# Patient Record
Sex: Female | Born: 1950 | Race: White | Hispanic: No | Marital: Married | Smoking: Never smoker
Health system: Southern US, Community
[De-identification: ages and names within clinical notes are randomized; demographics above are authoritative.]

## PROBLEM LIST (undated history)

## (undated) DIAGNOSIS — C801 Malignant (primary) neoplasm, unspecified: Secondary | ICD-10-CM

## (undated) DIAGNOSIS — E785 Hyperlipidemia, unspecified: Secondary | ICD-10-CM

## (undated) DIAGNOSIS — C55 Malignant neoplasm of uterus, part unspecified: Secondary | ICD-10-CM

## (undated) DIAGNOSIS — T8859XA Other complications of anesthesia, initial encounter: Secondary | ICD-10-CM

## (undated) DIAGNOSIS — Z923 Personal history of irradiation: Secondary | ICD-10-CM

## (undated) HISTORY — DX: Hyperlipidemia, unspecified: E78.5

## (undated) HISTORY — PX: AUGMENTATION MAMMAPLASTY: SUR837

## (undated) HISTORY — PX: MENISCUS REPAIR: SHX5179

## (undated) HISTORY — PX: BREAST EXCISIONAL BIOPSY: SUR124

---
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
## 2005-03-27 ENCOUNTER — Ambulatory Visit: Payer: Self-pay | Admitting: Gynecologic Oncology

## 2009-01-24 ENCOUNTER — Encounter: Admission: RE | Admit: 2009-01-24 | Discharge: 2009-01-24 | Payer: Self-pay | Admitting: Family Medicine

## 2011-05-27 ENCOUNTER — Other Ambulatory Visit: Payer: Self-pay | Admitting: Family Medicine

## 2011-05-27 DIAGNOSIS — Z1231 Encounter for screening mammogram for malignant neoplasm of breast: Secondary | ICD-10-CM

## 2011-06-12 ENCOUNTER — Ambulatory Visit
Admission: RE | Admit: 2011-06-12 | Discharge: 2011-06-12 | Disposition: A | Discharge status: Home / Self Care | Payer: BC Managed Care – PPO | Source: Ambulatory Visit | Attending: Family Medicine | Admitting: Family Medicine

## 2011-06-12 DIAGNOSIS — Z1231 Encounter for screening mammogram for malignant neoplasm of breast: Secondary | ICD-10-CM

## 2012-02-02 DIAGNOSIS — R9431 Abnormal electrocardiogram [ECG] [EKG]: Secondary | ICD-10-CM

## 2012-07-14 ENCOUNTER — Other Ambulatory Visit: Payer: Self-pay | Admitting: Family Medicine

## 2012-07-14 DIAGNOSIS — Z1231 Encounter for screening mammogram for malignant neoplasm of breast: Secondary | ICD-10-CM

## 2012-08-29 ENCOUNTER — Ambulatory Visit
Admission: RE | Admit: 2012-08-29 | Discharge: 2012-08-29 | Disposition: A | Discharge status: Home / Self Care | Payer: BC Managed Care – PPO | Source: Ambulatory Visit | Attending: Family Medicine | Admitting: Family Medicine

## 2012-08-29 DIAGNOSIS — Z1231 Encounter for screening mammogram for malignant neoplasm of breast: Secondary | ICD-10-CM

## 2013-07-12 ENCOUNTER — Other Ambulatory Visit (HOSPITAL_COMMUNITY)
Admission: RE | Admit: 2013-07-12 | Discharge: 2013-07-12 | Disposition: A | Discharge status: Home / Self Care | Payer: BC Managed Care – PPO | Source: Ambulatory Visit | Attending: Family Medicine | Admitting: Family Medicine

## 2013-07-12 ENCOUNTER — Other Ambulatory Visit: Payer: Self-pay | Admitting: Family Medicine

## 2013-07-12 DIAGNOSIS — Z01419 Encounter for gynecological examination (general) (routine) without abnormal findings: Secondary | ICD-10-CM | POA: Insufficient documentation

## 2013-08-14 ENCOUNTER — Other Ambulatory Visit: Payer: Self-pay

## 2013-08-14 DIAGNOSIS — Z1231 Encounter for screening mammogram for malignant neoplasm of breast: Secondary | ICD-10-CM

## 2013-09-29 ENCOUNTER — Ambulatory Visit
Admission: RE | Admit: 2013-09-29 | Discharge: 2013-09-29 | Disposition: A | Discharge status: Home / Self Care | Payer: BC Managed Care – PPO | Source: Ambulatory Visit

## 2013-09-29 DIAGNOSIS — Z1231 Encounter for screening mammogram for malignant neoplasm of breast: Secondary | ICD-10-CM

## 2014-09-19 ENCOUNTER — Other Ambulatory Visit: Payer: Self-pay | Admitting: Family Medicine

## 2014-09-19 ENCOUNTER — Other Ambulatory Visit (HOSPITAL_COMMUNITY)
Admission: RE | Admit: 2014-09-19 | Discharge: 2014-09-19 | Disposition: A | Discharge status: Home / Self Care | Payer: BC Managed Care – PPO | Source: Ambulatory Visit | Attending: Family Medicine | Admitting: Family Medicine

## 2014-09-19 DIAGNOSIS — Z01419 Encounter for gynecological examination (general) (routine) without abnormal findings: Secondary | ICD-10-CM | POA: Insufficient documentation

## 2014-09-24 LAB — CYTOLOGY - PAP

## 2014-10-22 ENCOUNTER — Other Ambulatory Visit: Payer: Self-pay

## 2014-10-22 DIAGNOSIS — Z1231 Encounter for screening mammogram for malignant neoplasm of breast: Secondary | ICD-10-CM

## 2014-11-06 ENCOUNTER — Ambulatory Visit
Admission: RE | Admit: 2014-11-06 | Discharge: 2014-11-06 | Disposition: A | Discharge status: Home / Self Care | Payer: 59 | Source: Ambulatory Visit

## 2014-11-06 DIAGNOSIS — Z1231 Encounter for screening mammogram for malignant neoplasm of breast: Secondary | ICD-10-CM

## 2015-09-23 ENCOUNTER — Other Ambulatory Visit (HOSPITAL_COMMUNITY)
Admission: RE | Admit: 2015-09-23 | Discharge: 2015-09-23 | Disposition: A | Discharge status: Home / Self Care | Payer: 59 | Source: Ambulatory Visit | Attending: Family Medicine | Admitting: Family Medicine

## 2015-09-23 ENCOUNTER — Other Ambulatory Visit: Payer: Self-pay | Admitting: Family Medicine

## 2015-09-23 DIAGNOSIS — Z01419 Encounter for gynecological examination (general) (routine) without abnormal findings: Secondary | ICD-10-CM | POA: Diagnosis not present

## 2015-09-24 LAB — CYTOLOGY - PAP

## 2015-10-03 ENCOUNTER — Other Ambulatory Visit: Payer: Self-pay

## 2015-10-03 DIAGNOSIS — Z1231 Encounter for screening mammogram for malignant neoplasm of breast: Secondary | ICD-10-CM

## 2015-11-08 ENCOUNTER — Ambulatory Visit
Admission: RE | Admit: 2015-11-08 | Discharge: 2015-11-08 | Disposition: A | Discharge status: Home / Self Care | Payer: 59 | Source: Ambulatory Visit

## 2015-11-08 DIAGNOSIS — Z1231 Encounter for screening mammogram for malignant neoplasm of breast: Secondary | ICD-10-CM

## 2016-05-05 ENCOUNTER — Other Ambulatory Visit: Payer: Self-pay | Admitting: Family Medicine

## 2016-05-07 ENCOUNTER — Other Ambulatory Visit: Payer: Self-pay | Admitting: Family Medicine

## 2016-05-07 DIAGNOSIS — Z8249 Family history of ischemic heart disease and other diseases of the circulatory system: Secondary | ICD-10-CM

## 2016-05-19 ENCOUNTER — Ambulatory Visit
Admission: RE | Admit: 2016-05-19 | Discharge: 2016-05-19 | Disposition: A | Discharge status: Home / Self Care | Payer: 59 | Source: Ambulatory Visit | Attending: Family Medicine | Admitting: Family Medicine

## 2016-05-19 DIAGNOSIS — Z8249 Family history of ischemic heart disease and other diseases of the circulatory system: Secondary | ICD-10-CM

## 2016-09-30 ENCOUNTER — Other Ambulatory Visit (HOSPITAL_COMMUNITY)
Admission: RE | Admit: 2016-09-30 | Discharge: 2016-09-30 | Disposition: A | Discharge status: Home / Self Care | Payer: 59 | Source: Ambulatory Visit | Attending: Family Medicine | Admitting: Family Medicine

## 2016-09-30 ENCOUNTER — Other Ambulatory Visit: Payer: Self-pay | Admitting: Family Medicine

## 2016-09-30 DIAGNOSIS — Z01419 Encounter for gynecological examination (general) (routine) without abnormal findings: Secondary | ICD-10-CM | POA: Diagnosis present

## 2016-10-02 ENCOUNTER — Other Ambulatory Visit: Payer: Self-pay | Admitting: Family Medicine

## 2016-10-02 DIAGNOSIS — Z1231 Encounter for screening mammogram for malignant neoplasm of breast: Secondary | ICD-10-CM

## 2016-10-02 LAB — CYTOLOGY - PAP: DIAGNOSIS: NEGATIVE

## 2016-10-26 ENCOUNTER — Other Ambulatory Visit: Payer: Self-pay | Admitting: Family Medicine

## 2016-11-09 ENCOUNTER — Ambulatory Visit
Admission: RE | Admit: 2016-11-09 | Discharge: 2016-11-09 | Disposition: A | Discharge status: Home / Self Care | Payer: 59 | Source: Ambulatory Visit | Attending: Family Medicine | Admitting: Family Medicine

## 2016-11-09 ENCOUNTER — Ambulatory Visit: Payer: 59

## 2016-11-09 DIAGNOSIS — Z1231 Encounter for screening mammogram for malignant neoplasm of breast: Secondary | ICD-10-CM

## 2016-11-10 ENCOUNTER — Ambulatory Visit: Payer: 59

## 2017-10-20 ENCOUNTER — Other Ambulatory Visit: Payer: Self-pay | Admitting: Family Medicine

## 2017-10-20 DIAGNOSIS — Z1231 Encounter for screening mammogram for malignant neoplasm of breast: Secondary | ICD-10-CM

## 2017-11-22 ENCOUNTER — Ambulatory Visit
Admission: RE | Admit: 2017-11-22 | Discharge: 2017-11-22 | Disposition: A | Discharge status: Home / Self Care | Payer: Medicare Other | Source: Ambulatory Visit | Attending: Family Medicine | Admitting: Family Medicine

## 2017-11-22 DIAGNOSIS — Z1231 Encounter for screening mammogram for malignant neoplasm of breast: Secondary | ICD-10-CM

## 2017-12-02 DIAGNOSIS — R300 Dysuria: Secondary | ICD-10-CM | POA: Diagnosis not present

## 2017-12-29 ENCOUNTER — Other Ambulatory Visit: Payer: Self-pay | Admitting: Family Medicine

## 2017-12-29 ENCOUNTER — Other Ambulatory Visit (HOSPITAL_COMMUNITY)
Admission: RE | Admit: 2017-12-29 | Discharge: 2017-12-29 | Disposition: A | Discharge status: Home / Self Care | Payer: Medicare Other | Source: Ambulatory Visit | Attending: Family Medicine | Admitting: Family Medicine

## 2017-12-29 DIAGNOSIS — E785 Hyperlipidemia, unspecified: Secondary | ICD-10-CM | POA: Diagnosis not present

## 2017-12-29 DIAGNOSIS — Z1389 Encounter for screening for other disorder: Secondary | ICD-10-CM | POA: Diagnosis not present

## 2017-12-29 DIAGNOSIS — Z0000 Encounter for general adult medical examination without abnormal findings: Secondary | ICD-10-CM | POA: Insufficient documentation

## 2017-12-29 DIAGNOSIS — Z124 Encounter for screening for malignant neoplasm of cervix: Secondary | ICD-10-CM | POA: Diagnosis not present

## 2017-12-29 DIAGNOSIS — M810 Age-related osteoporosis without current pathological fracture: Secondary | ICD-10-CM | POA: Diagnosis not present

## 2017-12-30 LAB — CYTOLOGY - PAP
Diagnosis: NEGATIVE
HPV: NOT DETECTED

## 2018-02-25 DIAGNOSIS — K1379 Other lesions of oral mucosa: Secondary | ICD-10-CM | POA: Diagnosis not present

## 2018-08-30 DIAGNOSIS — Z23 Encounter for immunization: Secondary | ICD-10-CM | POA: Diagnosis not present

## 2018-09-14 DIAGNOSIS — R350 Frequency of micturition: Secondary | ICD-10-CM | POA: Diagnosis not present

## 2018-09-14 DIAGNOSIS — N3000 Acute cystitis without hematuria: Secondary | ICD-10-CM | POA: Diagnosis not present

## 2018-09-19 DIAGNOSIS — H43393 Other vitreous opacities, bilateral: Secondary | ICD-10-CM | POA: Diagnosis not present

## 2018-10-28 ENCOUNTER — Other Ambulatory Visit: Payer: Self-pay | Admitting: Family Medicine

## 2018-10-28 DIAGNOSIS — Z1231 Encounter for screening mammogram for malignant neoplasm of breast: Secondary | ICD-10-CM

## 2018-11-28 ENCOUNTER — Other Ambulatory Visit: Payer: Self-pay | Admitting: Family Medicine

## 2018-11-28 ENCOUNTER — Ambulatory Visit
Admission: RE | Admit: 2018-11-28 | Discharge: 2018-11-28 | Disposition: A | Discharge status: Home / Self Care | Payer: Medicare Other | Source: Ambulatory Visit | Attending: Family Medicine | Admitting: Family Medicine

## 2018-11-28 DIAGNOSIS — Z1231 Encounter for screening mammogram for malignant neoplasm of breast: Secondary | ICD-10-CM

## 2018-12-29 DIAGNOSIS — E785 Hyperlipidemia, unspecified: Secondary | ICD-10-CM | POA: Diagnosis not present

## 2019-01-04 DIAGNOSIS — Z1389 Encounter for screening for other disorder: Secondary | ICD-10-CM | POA: Diagnosis not present

## 2019-01-04 DIAGNOSIS — Z0000 Encounter for general adult medical examination without abnormal findings: Secondary | ICD-10-CM | POA: Diagnosis not present

## 2019-01-04 DIAGNOSIS — M810 Age-related osteoporosis without current pathological fracture: Secondary | ICD-10-CM | POA: Diagnosis not present

## 2019-01-04 DIAGNOSIS — E785 Hyperlipidemia, unspecified: Secondary | ICD-10-CM | POA: Diagnosis not present

## 2019-01-06 DIAGNOSIS — N390 Urinary tract infection, site not specified: Secondary | ICD-10-CM | POA: Diagnosis not present

## 2019-06-14 DIAGNOSIS — H6122 Impacted cerumen, left ear: Secondary | ICD-10-CM | POA: Diagnosis not present

## 2019-06-14 DIAGNOSIS — H938X9 Other specified disorders of ear, unspecified ear: Secondary | ICD-10-CM | POA: Diagnosis not present

## 2019-07-04 DIAGNOSIS — D225 Melanocytic nevi of trunk: Secondary | ICD-10-CM | POA: Diagnosis not present

## 2019-07-04 DIAGNOSIS — L570 Actinic keratosis: Secondary | ICD-10-CM | POA: Diagnosis not present

## 2019-07-04 DIAGNOSIS — L723 Sebaceous cyst: Secondary | ICD-10-CM | POA: Diagnosis not present

## 2019-07-04 DIAGNOSIS — L814 Other melanin hyperpigmentation: Secondary | ICD-10-CM | POA: Diagnosis not present

## 2019-07-04 DIAGNOSIS — D1800 Hemangioma unspecified site: Secondary | ICD-10-CM | POA: Diagnosis not present

## 2019-07-08 DIAGNOSIS — Z23 Encounter for immunization: Secondary | ICD-10-CM | POA: Diagnosis not present

## 2019-09-06 DIAGNOSIS — R300 Dysuria: Secondary | ICD-10-CM | POA: Diagnosis not present

## 2019-09-20 DIAGNOSIS — Z20828 Contact with and (suspected) exposure to other viral communicable diseases: Secondary | ICD-10-CM | POA: Diagnosis not present

## 2019-11-02 ENCOUNTER — Other Ambulatory Visit: Payer: Self-pay | Admitting: Family Medicine

## 2019-11-02 DIAGNOSIS — Z9289 Personal history of other medical treatment: Secondary | ICD-10-CM

## 2019-12-22 ENCOUNTER — Ambulatory Visit: Payer: Medicare Other

## 2020-01-12 ENCOUNTER — Ambulatory Visit
Admission: RE | Admit: 2020-01-12 | Discharge: 2020-01-12 | Disposition: A | Discharge status: Home / Self Care | Payer: Medicare Other | Source: Ambulatory Visit | Attending: Family Medicine | Admitting: Family Medicine

## 2020-01-12 ENCOUNTER — Other Ambulatory Visit: Payer: Self-pay

## 2020-01-12 DIAGNOSIS — Z1231 Encounter for screening mammogram for malignant neoplasm of breast: Secondary | ICD-10-CM | POA: Diagnosis not present

## 2020-01-12 DIAGNOSIS — Z9289 Personal history of other medical treatment: Secondary | ICD-10-CM

## 2020-01-12 DIAGNOSIS — E785 Hyperlipidemia, unspecified: Secondary | ICD-10-CM | POA: Diagnosis not present

## 2020-01-15 DIAGNOSIS — M810 Age-related osteoporosis without current pathological fracture: Secondary | ICD-10-CM | POA: Diagnosis not present

## 2020-01-15 DIAGNOSIS — Z1389 Encounter for screening for other disorder: Secondary | ICD-10-CM | POA: Diagnosis not present

## 2020-01-15 DIAGNOSIS — E785 Hyperlipidemia, unspecified: Secondary | ICD-10-CM | POA: Diagnosis not present

## 2020-01-15 DIAGNOSIS — Z0000 Encounter for general adult medical examination without abnormal findings: Secondary | ICD-10-CM | POA: Diagnosis not present

## 2020-01-17 ENCOUNTER — Other Ambulatory Visit: Payer: Self-pay | Admitting: Family Medicine

## 2020-01-17 DIAGNOSIS — M810 Age-related osteoporosis without current pathological fracture: Secondary | ICD-10-CM

## 2020-04-01 ENCOUNTER — Ambulatory Visit
Admission: RE | Admit: 2020-04-01 | Discharge: 2020-04-01 | Disposition: A | Discharge status: Home / Self Care | Payer: Medicare Other | Source: Ambulatory Visit | Attending: Family Medicine | Admitting: Family Medicine

## 2020-04-01 ENCOUNTER — Other Ambulatory Visit: Payer: Self-pay

## 2020-04-01 DIAGNOSIS — M85851 Other specified disorders of bone density and structure, right thigh: Secondary | ICD-10-CM | POA: Diagnosis not present

## 2020-04-01 DIAGNOSIS — Z780 Asymptomatic menopausal state: Secondary | ICD-10-CM | POA: Diagnosis not present

## 2020-04-01 DIAGNOSIS — M810 Age-related osteoporosis without current pathological fracture: Secondary | ICD-10-CM | POA: Diagnosis not present

## 2020-07-18 DIAGNOSIS — D1800 Hemangioma unspecified site: Secondary | ICD-10-CM | POA: Diagnosis not present

## 2020-07-18 DIAGNOSIS — L814 Other melanin hyperpigmentation: Secondary | ICD-10-CM | POA: Diagnosis not present

## 2020-07-18 DIAGNOSIS — L603 Nail dystrophy: Secondary | ICD-10-CM | POA: Diagnosis not present

## 2020-07-18 DIAGNOSIS — L821 Other seborrheic keratosis: Secondary | ICD-10-CM | POA: Diagnosis not present

## 2020-07-18 DIAGNOSIS — D225 Melanocytic nevi of trunk: Secondary | ICD-10-CM | POA: Diagnosis not present

## 2020-07-18 DIAGNOSIS — L578 Other skin changes due to chronic exposure to nonionizing radiation: Secondary | ICD-10-CM | POA: Diagnosis not present

## 2020-07-23 DIAGNOSIS — N951 Menopausal and female climacteric states: Secondary | ICD-10-CM | POA: Diagnosis not present

## 2020-07-23 DIAGNOSIS — N950 Postmenopausal bleeding: Secondary | ICD-10-CM | POA: Diagnosis not present

## 2020-07-24 ENCOUNTER — Other Ambulatory Visit: Payer: Self-pay | Admitting: Family Medicine

## 2020-07-24 DIAGNOSIS — N950 Postmenopausal bleeding: Secondary | ICD-10-CM

## 2020-07-30 ENCOUNTER — Ambulatory Visit
Admission: RE | Admit: 2020-07-30 | Discharge: 2020-07-30 | Disposition: A | Discharge status: Home / Self Care | Payer: Medicare Other | Source: Ambulatory Visit | Attending: Family Medicine | Admitting: Family Medicine

## 2020-07-30 DIAGNOSIS — Z780 Asymptomatic menopausal state: Secondary | ICD-10-CM | POA: Diagnosis not present

## 2020-07-30 DIAGNOSIS — R9389 Abnormal findings on diagnostic imaging of other specified body structures: Secondary | ICD-10-CM | POA: Diagnosis not present

## 2020-07-30 DIAGNOSIS — N950 Postmenopausal bleeding: Secondary | ICD-10-CM | POA: Diagnosis not present

## 2020-08-07 DIAGNOSIS — Z1279 Encounter for screening for malignant neoplasm of other genitourinary organs: Secondary | ICD-10-CM | POA: Diagnosis not present

## 2020-08-07 DIAGNOSIS — C541 Malignant neoplasm of endometrium: Secondary | ICD-10-CM | POA: Diagnosis not present

## 2020-08-07 DIAGNOSIS — Z124 Encounter for screening for malignant neoplasm of cervix: Secondary | ICD-10-CM | POA: Diagnosis not present

## 2020-08-07 DIAGNOSIS — N950 Postmenopausal bleeding: Secondary | ICD-10-CM | POA: Diagnosis not present

## 2020-08-07 DIAGNOSIS — R8290 Unspecified abnormal findings in urine: Secondary | ICD-10-CM | POA: Diagnosis not present

## 2020-08-08 DIAGNOSIS — Z124 Encounter for screening for malignant neoplasm of cervix: Secondary | ICD-10-CM | POA: Diagnosis not present

## 2020-08-14 ENCOUNTER — Telehealth: Payer: Self-pay | Admitting: *Deleted

## 2020-08-14 NOTE — Telephone Encounter (Signed)
Called the patient and scheduled a new patient appt for 11/2 at 8:30 am with Dr Denman George. Patient given the address and phone number for the clinic. Patient also given the policy for mask and visitors

## 2020-08-19 ENCOUNTER — Encounter: Payer: Self-pay | Admitting: Gynecologic Oncology

## 2020-08-20 ENCOUNTER — Other Ambulatory Visit: Payer: Self-pay | Admitting: Gynecologic Oncology

## 2020-08-20 ENCOUNTER — Inpatient Hospital Stay: Payer: Medicare Other | Attending: Gynecologic Oncology | Admitting: Gynecologic Oncology

## 2020-08-20 ENCOUNTER — Other Ambulatory Visit: Payer: Self-pay

## 2020-08-20 ENCOUNTER — Encounter: Payer: Self-pay | Admitting: Gynecologic Oncology

## 2020-08-20 VITALS — BP 128/82 | HR 80 | Temp 97.9°F | Resp 18 | Ht 62.36 in | Wt 128.8 lb

## 2020-08-20 DIAGNOSIS — E7800 Pure hypercholesterolemia, unspecified: Secondary | ICD-10-CM | POA: Diagnosis not present

## 2020-08-20 DIAGNOSIS — Z79899 Other long term (current) drug therapy: Secondary | ICD-10-CM | POA: Insufficient documentation

## 2020-08-20 DIAGNOSIS — C541 Malignant neoplasm of endometrium: Secondary | ICD-10-CM | POA: Insufficient documentation

## 2020-08-20 MED ORDER — TRAMADOL HCL 50 MG PO TABS: 50.0000 mg | ORAL_TABLET | Freq: Four times a day (QID) | ORAL | 0 refills | Status: DC | PRN

## 2020-08-20 MED ORDER — SENNOSIDES-DOCUSATE SODIUM 8.6-50 MG PO TABS: 2.0000 | ORAL_TABLET | Freq: Every day | ORAL | 0 refills | Status: DC

## 2020-08-20 MED ORDER — IBUPROFEN 600 MG PO TABS: 600.0000 mg | ORAL_TABLET | Freq: Four times a day (QID) | ORAL | 0 refills | Status: DC | PRN

## 2020-08-20 NOTE — Patient Instructions (Signed)
Preparing for your Surgery  Plan for surgery on September 10, 2020 with Dr. Everitt Amber at [place] will be scheduled for a robotic assisted total laparoscopic hysterectomy (removal of the uterus and cervix), bilateral salpingo-oophorectomy (removal of both ovaries and fallopian tubes), sentinel lymph node biopsy, possible lymph node dissection.   Pre-operative Testing -You will receive a phone call from presurgical testing at [facility] to arrange for a pre-operative appointment, lab appointment, and COVID test. The COVID test normally happens 3 days prior to the surgery and they ask that you self quarantine after the test up until surgery to decrease chance of exposure.  -Bring your insurance card, copy of an advanced directive if applicable, medication list  -At that visit, you will be asked to sign a consent for a possible blood transfusion in case a transfusion becomes necessary during surgery.  The need for a blood transfusion is rare but having consent is a necessary part of your care.     -You should not be taking blood thinners or aspirin at least ten days prior to surgery unless instructed by your surgeon.  -Do not take supplements such as fish oil (omega 3), red yeast rice, turmeric before your surgery.   Day Before Surgery at [place] will be asked to take in a light diet the day before surgery. You will be advised you can have clear liquids up until 3 hours before your surgery.    Eat a light diet the day before surgery.  Examples including soups, broths, toast, yogurt, mashed potatoes.  AVOID GAS PRODUCING FOODS. Things to avoid include carbonated beverages (fizzy beverages), raw fruits and raw vegetables, or beans.   If your bowels are filled with gas, your surgeon will have difficulty visualizing your pelvic organs which increases your surgical risks.  Your role in recovery Your role is to become active as soon as directed by your doctor, while still  giving yourself time to heal.  Rest when you feel tired. You will be asked to do the following in order to speed your recovery:  - Cough and breathe deeply. This helps to clear and expand your lungs and can prevent pneumonia after surgery.  - [place]. Do mild physical activity. Walking or moving your legs help your circulation and body functions return to normal. Do not try to get up or walk alone the first time after surgery.   -If you develop swelling on one leg or the other, pain in the back of your leg, redness/warmth in one of your legs, please call the office or go to the Emergency Room to have a doppler to rule out a blood clot. For shortness of breath, chest pain-seek care in the Emergency Room as soon as possible. - Actively manage your pain. Managing your pain lets you move in comfort. We will ask you to rate your pain on a scale of zero to 10. It is your responsibility to tell your doctor or nurse where and how much you hurt so your pain can be treated.  Special Considerations -If you are diabetic, you may be placed on insulin after surgery to have closer control over your blood sugars to promote healing and recovery.  This does not mean that you will be discharged on insulin.  If applicable, your oral antidiabetics will be resumed when you are tolerating a solid diet.  -Your final pathology results from surgery should be available around one week after surgery and  the results will be relayed to you when available.  -Dr. Lahoma Crocker is the surgeon that assists your GYN Oncologist with surgery.  If you end up staying the night, the next day after your surgery you will either see Dr. Denman George, Dr. Berline Lopes, or Dr. Lahoma Crocker.  -FMLA forms can be faxed to [phone] and please allow 5-7 business days for completion.  Pain Management After Surgery -You have been prescribed your pain medication and bowel regimen medications before surgery so that you can have  these available when you are discharged from the hospital. The pain medication is for use ONLY AFTER surgery and a new prescription will not be given.   -Make sure that you have Tylenol and Ibuprofen at home to use on a regular basis after surgery for pain control. We recommend alternating the medications every hour to six hours since they work differently and are processed in the body differently for pain relief.  -Review the attached handout on narcotic use and their risks and side effects.   Bowel Regimen -You have been prescribed Sennakot-S to take nightly to prevent constipation especially if you are taking the narcotic pain medication intermittently.  It is important to prevent constipation and drink adequate amounts of liquids. You can stop taking this medication when you are not taking pain medication and you are back on your normal bowel routine.  Risks of Surgery Risks of surgery are low but include bleeding, infection, damage to surrounding structures, re-operation, blood clots, and very rarely death.   Blood Transfusion Information (For the consent to be signed before surgery)  We will be checking your blood type before surgery so in case of emergencies, we will know what type of blood you would need.                                            WHAT IS A BLOOD TRANSFUSION?  A transfusion is the replacement of blood or some of its parts. Blood is made up of multiple cells which provide different functions.  Red blood cells carry oxygen and are used for blood loss replacement.  White blood cells fight against infection.  Platelets control bleeding.  Plasma helps clot blood.  Other blood products are available for specialized needs, such as hemophilia or other clotting disorders. BEFORE THE TRANSFUSION  Who gives blood for transfusions?   You may be able to donate blood to be used at a later date on yourself (autologous donation).  Relatives can be asked to donate blood.  This is generally not any safer than if you have received blood from a stranger. The same precautions are taken to ensure safety when a relative's blood is donated.  Healthy volunteers who are fully evaluated to make sure their blood is safe. This is blood bank blood. Transfusion therapy is the safest it has ever been in the practice of medicine. Before blood is taken from a donor, a complete history is taken to make sure that person has no history of diseases nor engages in risky social behavior (examples are intravenous drug use or sexual activity with multiple partners). The donor's travel history is screened to minimize risk of transmitting infections, such as malaria. The donated blood is tested for signs of infectious diseases, such as HIV and hepatitis. The blood is then tested to be sure it is compatible with you in order to  minimize the chance of a transfusion reaction. If you or a relative donates blood, this is often done in anticipation of surgery and is not appropriate for emergency situations. It takes many days to process the donated blood. RISKS AND COMPLICATIONS Although transfusion therapy is very safe and saves many lives, the main dangers of transfusion include:   Getting an infectious disease.  Developing a transfusion reaction. This is an allergic reaction to something in the blood you were given. Every precaution is taken to prevent this. The decision to have a blood transfusion has been considered carefully by your caregiver before blood is given. Blood is not given unless the benefits outweigh the risks.  AFTER SURGERY INSTRUCTIONS  Return to work: 4 weeks if applicable  Activity: 1. Be up and out of the bed during the day.  Take a nap if needed.  You may walk up steps but be careful and use the hand rail.  Stair climbing will tire you more than you think, you may need to stop part way and rest.   2. No lifting or straining for 6 weeks over 10 pounds. No pushing, pulling,  straining for 6 weeks.  3. No driving for 1 week(s).  Do not drive if you are taking narcotic pain medicine and make sure that your reaction time has returned.   4. You can shower as soon as the next day after surgery. Shower daily.  Use your regular soap and water (not directly on the incision) and pat your incision(s) dry afterwards; don't rub.  No tub baths or submerging your body in water until cleared by your surgeon. If you have the soap that was given to you by pre-surgical testing that was used before surgery, you do not need to use it afterwards because this can irritate your incisions.   5. No sexual activity and nothing in the vagina for 8 weeks.  6. You may experience a small amount of clear drainage from your incisions, which is normal.  If the drainage persists, increases, or changes color please call the office.  7. Do not use creams, lotions, or ointments such as neosporin on your incisions after surgery until advised by your surgeon because they can cause removal of the dermabond glue on your incisions.    8. You may experience vaginal spotting after surgery or around the 6-8 week mark from surgery when the stitches at the top of the vagina begin to dissolve.  The spotting is normal but if you experience heavy bleeding, call our office.  9. Take Tylenol or ibuprofen first for pain and only use narcotic pain medication for severe pain not relieved by the Tylenol or Ibuprofen.  Monitor your Tylenol intake to a max of 4,000 mg in a 24 hour period. You can alternate these medications after surgery.  Diet: 1. Low sodium Heart Healthy Diet is recommended but you are cleared to resume your normal (before surgery) diet after your procedure.  2. It is safe to use a laxative, such as Miralax or Colace, if you have difficulty moving your bowels. You have been prescribed Sennakot at bedtime every evening to keep bowel movements regular and to prevent constipation.    Wound Care: 1. Keep  clean and dry.  Shower daily.  Reasons to call the Doctor:  Fever - Oral temperature greater than 100.4 degrees Fahrenheit  Foul-smelling vaginal discharge  Difficulty urinating  Nausea and vomiting  Increased pain at the site of the incision that is unrelieved with pain  medicine.  Difficulty breathing with or without chest pain  New calf pain especially if only on one side  Sudden, continuing increased vaginal bleeding with or without clots.   Contacts: For questions or concerns you should contact:  Dr. Everitt Amber at [phone]  Joylene John, NP at [phone]  After Hours: call [phone] and have the GYN Oncologist paged/contacted (after 5 pm or on the weekends)

## 2020-08-20 NOTE — H&P (View-Only) (Signed)
Consult Note: Gyn-Onc  Consult was requested by Dr. Ulanda Edison for the evaluation of Paula Newman 69 y.o. female  CC:  Chief Complaint  Patient presents with  . Endometrial cancer    Assessment/Plan:  Paula Newman  is a 69 y.o.  year old P1 with grade 1 endometrial cancer.  A detailed discussion was held with the patient with regard to to her endometrial cancer diagnosis. We discussed the standard management options for uterine cancer which includes surgery followed possibly by adjuvant therapy depending on the results of surgery. The surgical management include a robotic assisted total hysterectomy and removal of the tubes and ovaries with sampling of lymph nodes. If a minimally invasive approach is not feasible, a laparotomy may be necessary (including for specimen delivery). The patient has been counseled about these surgical options and the risks of surgery in general including infection, bleeding, damage to surrounding structures (including bowel, bladder, ureters, nerves or vessels), and the postoperative risks of PE/ DVT, and lymphedema. After counseling and consideration of her options, she is in agreement to proceed with robotic assisted total hysterectomy with bilateral sapingo-oophorectomy and SLN biopsy.   She will be seen by anesthesia for preoperative clearance and discussion of postoperative pain management.  She was given the opportunity to ask questions, which were answered to her satisfaction, and she is agreement with the above mentioned plan of care.   We explained that robotic hysterectomy is typically an outpatient procedure with same day discharge provided that she is meeting appropriate discharge criteria from the PACU. We provided extensive counseling regarding post-operative expectations for recovery and restrictions/limitations. We provided information regarding multi-modal pain therapy and the importance of avoidance of opioids.   We explained that after  surgery we will review her definitive pathology and determine if adjuvant therapy is recommended.   HPI: Paula Newman is a 69 year old P 1 who was seen in consultation at the request of Dr Ulanda Edison for evaluation and treatment of grade 1 endometrial cancer.   Her symptoms began in October 2021 with vaginal bleeding which was postmenopausal.  She saw her primary care physician for a symptom related visit who referred her to see Dr. Ulanda Edison (she did not have a gynecologist that she received GYN care from her PCP) on August 07, 2020.  Work-up of symptoms included a transvaginal ultrasound and endometrial Pipelle and Pap smear. Transvaginal US on July 30, 2020 showed a uterus measuring 7.7 x 3.9 x 5.3 cm with an endometrial thickness of 23 mm. 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 Pap testing was normal on 08/07/2020.  The patient is otherwise quite healthy with hypercholesterolemia is her only chronic health complaint.  She has had 1 prior SVD of a 9 pound 14 ounce baby.  She has had no prior abdominal surgeries.  Her family history is remarkable for sister who had breast cancer.  The patient denied a history of exogenous estrogen use including hormone replacement therapy.  Current Meds:  Outpatient Encounter Medications as of 08/20/2020  Medication Sig  . ezetimibe (ZETIA) 10 MG tablet Take 10 mg by mouth daily.  . simvastatin (ZOCOR) 20 MG tablet Take 20 mg by mouth daily.   No facility-administered encounter medications on file as of 08/20/2020.    Allergy:  Allergies  Allergen Reactions  . Codeine Nausea And Vomiting    Social Hx:   Social History   Socioeconomic  History  . Marital status: Married    Spouse name: Not on file  . Number of children: 1  . Years of education: Not on file  . Highest education level: Not on file  Occupational History  . Not on  file  Tobacco Use  . Smoking status: Never Smoker  . Smokeless tobacco: Never Used  Substance and Sexual Activity  . Alcohol use: Yes    Comment: rare occasion  . Drug use: Never  . Sexual activity: Not on file  Other Topics Concern  . Not on file  Social History Narrative  . Not on file   Social Determinants of Health   Financial Resource Strain:   . Difficulty of Paying Living Expenses: Not on file  Food Insecurity:   . Worried About Charity fundraiser in the Last Year: Not on file  . Ran Out of Food in the Last Year: Not on file  Transportation Needs:   . Lack of Transportation (Medical): Not on file  . Lack of Transportation (Non-Medical): Not on file  Physical Activity:   . Days of Exercise per Week: Not on file  . Minutes of Exercise per Session: Not on file  Stress:   . Feeling of Stress : Not on file  Social Connections:   . Frequency of Communication with Friends and Family: Not on file  . Frequency of Social Gatherings with Friends and Family: Not on file  . Attends Religious Services: Not on file  . Active Member of Clubs or Organizations: Not on file  . Attends Archivist Meetings: Not on file  . Marital Status: Not on file  Intimate Partner Violence:   . Fear of Current or Ex-Partner: Not on file  . Emotionally Abused: Not on file  . Physically Abused: Not on file  . Sexually Abused: Not on file    Past Surgical Hx:  Past Surgical History:  Procedure Laterality Date  . AUGMENTATION MAMMAPLASTY    . BREAST EXCISIONAL BIOPSY Bilateral   . MENISCUS REPAIR Bilateral     Past Medical Hx:  Past Medical History:  Diagnosis Date  . Hyperlipidemia   . Uterine cancer [facility])     Past Gynecological History:  See HPI, svd x 1, no hx of abnormal paps No LMP recorded. Patient is postmenopausal.  Family Hx:  Family History  Problem Relation Age of Onset  . Hypertension Mother   . Hypertension Father   . Breast cancer Sister 48  . Breast cancer  Paternal Grandmother     Review of Systems:  Constitutional  Feels well,    ENT Normal appearing ears and nares bilaterally Skin/Breast  No rash, sores, jaundice, itching, dryness Cardiovascular  No chest pain, shortness of breath, or edema  Pulmonary  No cough or wheeze.  Gastro Intestinal  No nausea, vomitting, or diarrhoea. No bright red blood per rectum, no abdominal pain, change in bowel movement, or constipation.  Genito Urinary  No frequency, urgency, dysuria, + postmenopausal bleeding Musculo Skeletal  No myalgia, arthralgia, joint swelling or pain  Neurologic  No weakness, numbness, change in gait,  Psychology  No depression, anxiety, insomnia.   Vitals:  Blood pressure 128/82, pulse 80, temperature 97.9 F (36.6 C), temperature source Tympanic, resp. rate 18, height 5' 2.36" (1.584 m), weight 128 lb 12.8 oz (58.4 kg), SpO2 100 %.  Physical Exam: WD in NAD Neck  Supple NROM, without any enlargements.  Lymph Node Survey No cervical supraclavicular or inguinal adenopathy Cardiovascular  Pulse normal rate, regularity and rhythm. S1 and S2 normal.  Lungs  Clear to auscultation bilateraly, without wheezes/crackles/rhonchi. Good air movement.  Skin  No rash/lesions/breakdown  Psychiatry  Alert and oriented to person, place, and time  Abdomen  Normoactive bowel sounds, abdomen soft, non-tender and thin without evidence of hernia.  Back No CVA tenderness Genito Urinary  Vulva/vagina: Normal external female genitalia.  No lesions. No discharge or bleeding.  Bladder/urethra:  No lesions or masses, well supported bladder  Vagina: no lesions  Cervix: Normal appearing, no lesions.  Uterus: Small, mobile, no parametrial involvement or nodularity.  Adnexa: no palpable masses. Rectal  deferred Extremities  No bilateral cyanosis, clubbing or edema.  60 minutes of total time was spent for this patient encounter, including preparation, face-to-face counseling with the  patient and coordination of care, and documentation of the encounter.   Thereasa Solo, MD  08/20/2020, 9:17 AM

## 2020-08-20 NOTE — Progress Notes (Signed)
Consult Note: Gyn-Onc  Consult was requested by Dr. Ulanda Edison for the evaluation of Paula Newman 69 y.o. female  CC:  Chief Complaint  Patient presents with  . Endometrial cancer    Assessment/Plan:  Paula Newman  is a 69 y.o.  year old P1 with grade 1 endometrial cancer.  A detailed discussion was held with the patient with regard to to her endometrial cancer diagnosis. We discussed the standard management options for uterine cancer which includes surgery followed possibly by adjuvant therapy depending on the results of surgery. The surgical management include a robotic assisted total hysterectomy and removal of the tubes and ovaries with sampling of lymph nodes. If a minimally invasive approach is not feasible, a laparotomy may be necessary (including for specimen delivery). The patient has been counseled about these surgical options and the risks of surgery in general including infection, bleeding, damage to surrounding structures (including bowel, bladder, ureters, nerves or vessels), and the postoperative risks of PE/ DVT, and lymphedema. After counseling and consideration of her options, she is in agreement to proceed with robotic assisted total hysterectomy with bilateral sapingo-oophorectomy and SLN biopsy.   She will be seen by anesthesia for preoperative clearance and discussion of postoperative pain management.  She was given the opportunity to ask questions, which were answered to her satisfaction, and she is agreement with the above mentioned plan of care.   We explained that robotic hysterectomy is typically an outpatient procedure with same day discharge provided that she is meeting appropriate discharge criteria from the PACU. We provided extensive counseling regarding post-operative expectations for recovery and restrictions/limitations. We provided information regarding multi-modal pain therapy and the importance of avoidance of opioids.   We explained that after  surgery we will review her definitive pathology and determine if adjuvant therapy is recommended.   HPI: Paula Newman is a 69 year old P 1 who was seen in consultation at the request of Dr Ulanda Edison for evaluation and treatment of grade 1 endometrial cancer.   Her symptoms began in October 2021 with vaginal bleeding which was postmenopausal.  She saw her primary care physician for a symptom related visit who referred her to see Dr. Ulanda Edison (she did not have a gynecologist that she received GYN care from her PCP) on August 07, 2020.  Work-up of symptoms included a transvaginal ultrasound and endometrial Pipelle and Pap smear. Transvaginal US on July 30, 2020 showed a uterus measuring 7.7 x 3.9 x 5.3 cm with an endometrial thickness of 23 mm. 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 Pap testing was normal on 08/07/2020.  The patient is otherwise quite healthy with hypercholesterolemia is her only chronic health complaint.  She has had 1 prior SVD of a 9 pound 14 ounce baby.  She has had no prior abdominal surgeries.  Her family history is remarkable for sister who had breast cancer.  The patient denied a history of exogenous estrogen use including hormone replacement therapy.  Current Meds:  Outpatient Encounter Medications as of 08/20/2020  Medication Sig  . ezetimibe (ZETIA) 10 MG tablet Take 10 mg by mouth daily.  . simvastatin (ZOCOR) 20 MG tablet Take 20 mg by mouth daily.   No facility-administered encounter medications on file as of 08/20/2020.    Allergy:  Allergies  Allergen Reactions  . Codeine Nausea And Vomiting    Social Hx:   Social History   Socioeconomic  History  . Marital status: Married    Spouse name: Not on file  . Number of children: 1  . Years of education: Not on file  . Highest education level: Not on file  Occupational History  . Not on  file  Tobacco Use  . Smoking status: Never Smoker  . Smokeless tobacco: Never Used  Substance and Sexual Activity  . Alcohol use: Yes    Comment: rare occasion  . Drug use: Never  . Sexual activity: Not on file  Other Topics Concern  . Not on file  Social History Narrative  . Not on file   Social Determinants of Health   Financial Resource Strain:   . Difficulty of Paying Living Expenses: Not on file  Food Insecurity:   . Worried About Charity fundraiser in the Last Year: Not on file  . Ran Out of Food in the Last Year: Not on file  Transportation Needs:   . Lack of Transportation (Medical): Not on file  . Lack of Transportation (Non-Medical): Not on file  Physical Activity:   . Days of Exercise per Week: Not on file  . Minutes of Exercise per Session: Not on file  Stress:   . Feeling of Stress : Not on file  Social Connections:   . Frequency of Communication with Friends and Family: Not on file  . Frequency of Social Gatherings with Friends and Family: Not on file  . Attends Religious Services: Not on file  . Active Member of Clubs or Organizations: Not on file  . Attends Archivist Meetings: Not on file  . Marital Status: Not on file  Intimate Partner Violence:   . Fear of Current or Ex-Partner: Not on file  . Emotionally Abused: Not on file  . Physically Abused: Not on file  . Sexually Abused: Not on file    Past Surgical Hx:  Past Surgical History:  Procedure Laterality Date  . AUGMENTATION MAMMAPLASTY    . BREAST EXCISIONAL BIOPSY Bilateral   . MENISCUS REPAIR Bilateral     Past Medical Hx:  Past Medical History:  Diagnosis Date  . Hyperlipidemia   . Uterine cancer [facility])     Past Gynecological History:  See HPI, svd x 1, no hx of abnormal paps No LMP recorded. Patient is postmenopausal.  Family Hx:  Family History  Problem Relation Age of Onset  . Hypertension Mother   . Hypertension Father   . Breast cancer Sister 53  . Breast cancer  Paternal Grandmother     Review of Systems:  Constitutional  Feels well,    ENT Normal appearing ears and nares bilaterally Skin/Breast  No rash, sores, jaundice, itching, dryness Cardiovascular  No chest pain, shortness of breath, or edema  Pulmonary  No cough or wheeze.  Gastro Intestinal  No nausea, vomitting, or diarrhoea. No bright red blood per rectum, no abdominal pain, change in bowel movement, or constipation.  Genito Urinary  No frequency, urgency, dysuria, + postmenopausal bleeding Musculo Skeletal  No myalgia, arthralgia, joint swelling or pain  Neurologic  No weakness, numbness, change in gait,  Psychology  No depression, anxiety, insomnia.   Vitals:  Blood pressure 128/82, pulse 80, temperature 97.9 F (36.6 C), temperature source Tympanic, resp. rate 18, height 5' 2.36" (1.584 m), weight 128 lb 12.8 oz (58.4 kg), SpO2 100 %.  Physical Exam: WD in NAD Neck  Supple NROM, without any enlargements.  Lymph Node Survey No cervical supraclavicular or inguinal adenopathy Cardiovascular  Pulse normal rate, regularity and rhythm. S1 and S2 normal.  Lungs  Clear to auscultation bilateraly, without wheezes/crackles/rhonchi. Good air movement.  Skin  No rash/lesions/breakdown  Psychiatry  Alert and oriented to person, place, and time  Abdomen  Normoactive bowel sounds, abdomen soft, non-tender and thin without evidence of hernia.  Back No CVA tenderness Genito Urinary  Vulva/vagina: Normal external female genitalia.  No lesions. No discharge or bleeding.  Bladder/urethra:  No lesions or masses, well supported bladder  Vagina: no lesions  Cervix: Normal appearing, no lesions.  Uterus: Small, mobile, no parametrial involvement or nodularity.  Adnexa: no palpable masses. Rectal  deferred Extremities  No bilateral cyanosis, clubbing or edema.  60 minutes of total time was spent for this patient encounter, including preparation, face-to-face counseling with the  patient and coordination of care, and documentation of the encounter.   Thereasa Solo, MD  08/20/2020, 9:17 AM

## 2020-08-26 DIAGNOSIS — R059 Cough, unspecified: Secondary | ICD-10-CM | POA: Diagnosis not present

## 2020-08-29 NOTE — Patient Instructions (Addendum)
DUE TO COVID-19 ONLY ONE VISITOR IS ALLOWED TO COME WITH YOU AND STAY IN THE WAITING ROOM ONLY DURING PRE OP AND PROCEDURE DAY OF SURGERY. THE 1 VISITOR  MAY VISIT WITH YOU AFTER SURGERY IN YOUR PRIVATE ROOM DURING VISITING HOURS ONLY!  YOU NEED TO HAVE A COVID 19 TEST ON__11-19-21_____ @_______ , THIS TEST MUST BE DONE BEFORE SURGERY,  COVID TESTING SITE [address] [place] [address], IT IS ON THE RIGHT GOING OUT [address] APPROXIMATELY  2 MINUTES PAST [employer] ON THE RIGHT. ONCE YOUR COVID TEST IS COMPLETED,  PLEASE BEGIN THE QUARANTINE INSTRUCTIONS AS OUTLINED IN YOUR HANDOUT.                Paula Newman  08/29/2020   Your procedure is scheduled on: 09-10-20    Report to [facility] Main  Entrance   Report to admitting at      1130 AM     Call this number if you have problems the morning of surgery [phone]    Remember: Eat a light diet the day before surgery.  Examples including soups, broths, toast, yogurt, mashed potatoes.  Things to avoid include carbonated beverages (fizzy beverages), raw fruits and raw vegetables, or beans.   If your bowels are filled with gas, your surgeon will have difficulty visualizing your pelvic organs which increases your surgical risks.  Do not eat food After Midnight   YOU MAY HAVE CLEAR LIQUIDS UNTIL 1030 AM THEN NOTHING BY MOUTH     CLEAR LIQUID DIET   Foods Allowed                                                                                            Foods Excluded  BLACK Coffee and tea, regular and decaf                                            liquids that you cannot  Plain Jell-O any favor except red or purple                                                               see through such as: Fruit ices (not with fruit pulp)                                                           milk, soups, orange juice  Iced Popsicles  All solid  food                                Cranberry, grape and apple juices Sports drinks like Gatorade Lightly seasoned clear broth or consume(fat free) Sugar, honey syrup  _____________________________________________________________________    BRUSH YOUR TEETH MORNING OF SURGERY AND RINSE YOUR MOUTH OUT, NO CHEWING GUM CANDY OR MINTS.     Take these medicines the morning of surgery with A SIP OF WATER: NONE                                 You may not have any metal on your body including hair pins and              piercings  Do not wear jewelry, make-up, lotions, powders or perfumes, deodorant             Do not wear nail polish on your fingernails.  Do not shave  48 hours prior to surgery.     Do not bring valuables to the hospital. [place].  Contacts, dentures or bridgework may not be worn into surgery.      Patients discharged the day of surgery will not be allowed to drive home. IF YOU ARE HAVING SURGERY AND GOING HOME THE SAME DAY, YOU MUST HAVE AN ADULT TO DRIVE YOU HOME AND BE WITH YOU FOR 24 HOURS. YOU MAY GO HOME BY TAXI OR UBER OR ORTHERWISE, BUT AN ADULT MUST ACCOMPANY YOU HOME AND STAY WITH YOU FOR 24 HOURS.  Name and phone number of your driver:  Special Instructions: N/A              Please read over the following fact sheets you were given: _____________________________________________________________________             [facility] - Preparing for Surgery Before surgery, you can play an important role.  Because skin is not sterile, your skin needs to be as free of germs as possible.  You can reduce the number of germs on your skin by washing with CHG (chlorahexidine gluconate) soap before surgery.  CHG is an antiseptic cleaner which kills germs and bonds with the skin to continue killing germs even after washing. Please DO NOT use if you have an allergy to CHG or antibacterial soaps.  If your skin becomes  reddened/irritated stop using the CHG and inform your nurse when you arrive at Short Stay. Do not shave (including legs and underarms) for at least 48 hours prior to the first CHG shower.  You may shave your face/neck. Please follow these instructions carefully:  1.  Shower with CHG Soap the night before surgery and the  morning of Surgery.  2.  If you choose to wash your hair, wash your hair first as usual with your  normal  shampoo.  3.  After you shampoo, rinse your hair and body thoroughly to remove the  shampoo.                           4.  Use CHG as you would any other liquid soap.  You can apply chg directly  to the skin and wash  Gently with a scrungie or clean washcloth.  5.  Apply the CHG Soap to your body ONLY FROM THE NECK DOWN.   Do not use on face/ open                           Wound or open sores. Avoid contact with eyes, ears mouth and genitals (private parts).                       Wash face,  Genitals (private parts) with your normal soap.             6.  Wash thoroughly, paying special attention to the area where your surgery  will be performed.  7.  Thoroughly rinse your body with warm water from the neck down.  8.  DO NOT shower/wash with your normal soap after using and rinsing off  the CHG Soap.                9.  Pat yourself dry with a clean towel.            10.  Wear clean pajamas.            11.  Place clean sheets on your bed the night of your first shower and do not  sleep with pets. Day of Surgery : Do not apply any lotions/deodorants the morning of surgery.  Please wear clean clothes to the hospital/surgery center.  FAILURE TO FOLLOW THESE INSTRUCTIONS MAY RESULT IN THE CANCELLATION OF YOUR SURGERY PATIENT SIGNATURE_________________________________  NURSE SIGNATURE__________________________________  ________________________________________________________________________   Paula Newman  An incentive spirometer is a tool that  can help keep your lungs clear and active. This tool measures how well you are filling your lungs with each breath. Taking long deep breaths may help reverse or decrease the chance of developing breathing (pulmonary) problems (especially infection) following:  A long period of time when you are unable to move or be active. BEFORE THE PROCEDURE   If the spirometer includes an indicator to show your best effort, your nurse or respiratory therapist will set it to a desired goal.  If possible, sit up straight or lean slightly forward. Try not to slouch.  Hold the incentive spirometer in an upright position. INSTRUCTIONS FOR USE  1. Sit on the edge of your bed if possible, or sit up as far as you can in bed or on a chair. 2. Hold the incentive spirometer in an upright position. 3. Breathe out normally. 4. Place the mouthpiece in your mouth and seal your lips tightly around it. 5. Breathe in slowly and as deeply as possible, raising the piston or the ball toward the top of the column. 6. Hold your breath for 3-5 seconds or for as long as possible. Allow the piston or ball to fall to the bottom of the column. 7. Remove the mouthpiece from your mouth and breathe out normally. 8. Rest for a few seconds and repeat Steps 1 through 7 at least 10 times every 1-2 hours when you are awake. Take your time and take a few normal breaths between deep breaths. 9. The spirometer may include an indicator to show your best effort. Use the indicator as a goal to work toward during each repetition. 10. After each set of 10 deep breaths, practice coughing to be sure your lungs are clear. If you have an incision (the cut made at the time of surgery),  support your incision when coughing by placing a pillow or rolled up towels firmly against it. Once you are able to get out of bed, walk around indoors and cough well. You may stop using the incentive spirometer when instructed by your caregiver.  RISKS AND  COMPLICATIONS  Take your time so you do not get dizzy or light-headed.  If you are in pain, you may need to take or ask for pain medication before doing incentive spirometry. It is harder to take a deep breath if you are having pain. AFTER USE  Rest and breathe slowly and easily.  It can be helpful to keep track of a log of your progress. Your caregiver can provide you with a simple table to help with this. If you are using the spirometer at home, follow these instructions: Paula Newman IF:   You are having difficultly using the spirometer.  You have trouble using the spirometer as often as instructed.  Your pain medication is not giving enough relief while using the spirometer.  You develop fever of 100.5 F (38.1 C) or higher. SEEK IMMEDIATE MEDICAL CARE IF:   You cough up bloody sputum that had not been present before.  You develop fever of 102 F (38.9 C) or greater.  You develop worsening pain at or near the incision site. MAKE SURE YOU:   Understand these instructions.  Will watch your condition.  Will get help right away if you are not doing well or get worse. Document Released: 02/15/2007 Document Revised: 12/28/2011 Document Reviewed: 04/18/2007 [facility] Patient Information 2014 [place], [place].   ________________________________________________________________________

## 2020-08-29 NOTE — Progress Notes (Addendum)
PCP - [name], MD Cardiologist - no  PPM/ICD -  Device Orders -  Rep Notified -   Chest x-ray -  EKG -  Stress Test -  ECHO -  Cardiac Cath -   Sleep Study -  CPAP -   Fasting Blood Sugar -  Checks Blood Sugar _____ times a day  Blood Thinner Instructions: Aspirin Instructions:  ERAS Protcol -no PRE-SURGERY  COVID TEST- 11-19  Activity- Does yard work, can walk a flight of stairs without sob Anesthesia review: osteoporosis, hyperlipidemia  Patient denies shortness of breath, fever, cough and chest pain at PAT appointment   All instructions explained to the patient, with a verbal understanding of the material. Patient agrees to go over the instructions while at home for a better understanding. Patient also instructed to self quarantine after being tested for COVID-19. The opportunity to ask questions was provided.

## 2020-09-03 ENCOUNTER — Encounter (HOSPITAL_COMMUNITY): Payer: Self-pay

## 2020-09-03 ENCOUNTER — Encounter (HOSPITAL_COMMUNITY)
Admission: RE | Admit: 2020-09-03 | Discharge: 2020-09-03 | Disposition: A | Discharge status: Home / Self Care | Payer: Medicare Other | Source: Ambulatory Visit | Attending: Gynecologic Oncology | Admitting: Gynecologic Oncology

## 2020-09-03 ENCOUNTER — Other Ambulatory Visit: Payer: Self-pay

## 2020-09-03 DIAGNOSIS — Z01812 Encounter for preprocedural laboratory examination: Secondary | ICD-10-CM | POA: Insufficient documentation

## 2020-09-03 HISTORY — DX: Other complications of anesthesia, initial encounter: T88.59XA

## 2020-09-03 HISTORY — DX: Malignant (primary) neoplasm, unspecified: C80.1

## 2020-09-03 LAB — URINALYSIS, ROUTINE W REFLEX MICROSCOPIC
Bilirubin Urine: NEGATIVE
Glucose, UA: NEGATIVE mg/dL
Hgb urine dipstick: NEGATIVE
Ketones, ur: NEGATIVE mg/dL
Leukocytes,Ua: NEGATIVE
Nitrite: NEGATIVE
Protein, ur: NEGATIVE mg/dL
Specific Gravity, Urine: 1.004 — ABNORMAL LOW (ref 1.005–1.030)
pH: 6 (ref 5.0–8.0)

## 2020-09-03 LAB — COMPREHENSIVE METABOLIC PANEL
ALT: 14 U/L (ref 0–44)
AST: 19 U/L (ref 15–41)
Albumin: 4.7 g/dL (ref 3.5–5.0)
Alkaline Phosphatase: 54 U/L (ref 38–126)
Anion gap: 7 (ref 5–15)
BUN: 12 mg/dL (ref 8–23)
CO2: 27 mmol/L (ref 22–32)
Calcium: 9 mg/dL (ref 8.9–10.3)
Chloride: 105 mmol/L (ref 98–111)
Creatinine, Ser: 0.55 mg/dL (ref 0.44–1.00)
GFR, Estimated: >60 mL/min (ref >60)
Glucose, Bld: 94 mg/dL (ref 70–99)
Potassium: 4.1 mmol/L (ref 3.5–5.1)
Sodium: 139 mmol/L (ref 135–145)
Total Bilirubin: 0.6 mg/dL (ref 0.3–1.2)
Total Protein: 7.2 g/dL (ref 6.5–8.1)

## 2020-09-03 LAB — CBC
HCT: 38.7 % (ref 36.0–46.0)
Hemoglobin: 12.8 g/dL (ref 12.0–15.0)
MCH: 29.6 pg (ref 26.0–34.0)
MCHC: 33.1 g/dL (ref 30.0–36.0)
MCV: 89.4 fL (ref 80.0–100.0)
Platelets: 283 10*3/uL (ref 150–400)
RBC: 4.33 MIL/uL (ref 3.87–5.11)
RDW: 11.8 % (ref 11.5–15.5)
WBC: 6 10*3/uL (ref 4.0–10.5)
nRBC: 0 % (ref 0.0–0.2)

## 2020-09-06 ENCOUNTER — Other Ambulatory Visit (HOSPITAL_COMMUNITY)
Admission: RE | Admit: 2020-09-06 | Discharge: 2020-09-06 | Disposition: A | Discharge status: Home / Self Care | Payer: Medicare Other | Source: Ambulatory Visit | Attending: Gynecologic Oncology | Admitting: Gynecologic Oncology

## 2020-09-06 DIAGNOSIS — Z20822 Contact with and (suspected) exposure to covid-19: Secondary | ICD-10-CM | POA: Insufficient documentation

## 2020-09-06 DIAGNOSIS — Z01812 Encounter for preprocedural laboratory examination: Secondary | ICD-10-CM | POA: Insufficient documentation

## 2020-09-06 LAB — SARS CORONAVIRUS 2 (TAT 6-24 HRS): SARS Coronavirus 2: NEGATIVE

## 2020-09-09 ENCOUNTER — Telehealth: Payer: Self-pay

## 2020-09-09 NOTE — Anesthesia Preprocedure Evaluation (Addendum)
Anesthesia Evaluation  Patient identified by MRN, date of birth, ID band Patient awake    Reviewed: Allergy & Precautions, NPO status , Patient's Chart, lab work & pertinent test results  Airway Mallampati: II  TM Distance: >3 FB Neck ROM: Full    Dental no notable dental hx. (+) Teeth Intact, Dental Advisory Given   Pulmonary neg pulmonary ROS,    Pulmonary exam normal breath sounds clear to auscultation       Cardiovascular Exercise Tolerance: Good Normal cardiovascular exam Rhythm:Regular Rate:Normal     Neuro/Psych negative neurological ROS  negative psych ROS   GI/Hepatic negative GI ROS, Neg liver ROS,   Endo/Other  negative endocrine ROS  Renal/GU K+ 4.1 Cr 0.58   Endometrial CA negative genitourinary   Musculoskeletal negative musculoskeletal ROS (+)   Abdominal   Peds  Hematology Hgb 12.8 Plt 283   Anesthesia Other Findings   Reproductive/Obstetrics negative OB ROS                            Anesthesia Physical Anesthesia Plan  ASA: II  Anesthesia Plan: General   Post-op Pain Management:    Induction:   PONV Risk Score and Plan: 4 or greater and Treatment may vary due to age or medical condition, Ondansetron, Dexamethasone and Midazolam  Airway Management Planned: Oral ETT  Additional Equipment: None  Intra-op Plan:   Post-operative Plan: Extubation in OR  Informed Consent: I have reviewed the patients History and Physical, chart, labs and discussed the procedure including the risks, benefits and alternatives for the proposed anesthesia with the patient or authorized representative who has indicated his/her understanding and acceptance.     Dental advisory given  Plan Discussed with: CRNA and Anesthesiologist  Anesthesia Plan Comments: (GA w lidocaine infusion)       Anesthesia Quick Evaluation

## 2020-09-09 NOTE — Telephone Encounter (Signed)
Paula Newman states that she understands her written pre op instructions for surgery tomorrow 09-10-20.

## 2020-09-10 ENCOUNTER — Other Ambulatory Visit: Payer: Self-pay

## 2020-09-10 ENCOUNTER — Ambulatory Visit (HOSPITAL_COMMUNITY)
Admission: RE | Admit: 2020-09-10 | Discharge: 2020-09-10 | Disposition: A | Discharge status: Home / Self Care | Payer: Medicare Other | Attending: Gynecologic Oncology | Admitting: Gynecologic Oncology

## 2020-09-10 ENCOUNTER — Ambulatory Visit (HOSPITAL_COMMUNITY): Payer: Medicare Other | Admitting: Anesthesiology

## 2020-09-10 ENCOUNTER — Encounter (HOSPITAL_COMMUNITY): Payer: Self-pay | Admitting: Gynecologic Oncology

## 2020-09-10 ENCOUNTER — Encounter (HOSPITAL_COMMUNITY)
Admission: RE | Disposition: A | Discharge status: Home / Self Care | Payer: Self-pay | Source: Home / Self Care | Attending: Gynecologic Oncology

## 2020-09-10 DIAGNOSIS — C775 Secondary and unspecified malignant neoplasm of intrapelvic lymph nodes: Secondary | ICD-10-CM | POA: Insufficient documentation

## 2020-09-10 DIAGNOSIS — C55 Malignant neoplasm of uterus, part unspecified: Secondary | ICD-10-CM | POA: Insufficient documentation

## 2020-09-10 DIAGNOSIS — Z803 Family history of malignant neoplasm of breast: Secondary | ICD-10-CM | POA: Diagnosis not present

## 2020-09-10 DIAGNOSIS — Z885 Allergy status to narcotic agent status: Secondary | ICD-10-CM | POA: Insufficient documentation

## 2020-09-10 DIAGNOSIS — E7800 Pure hypercholesterolemia, unspecified: Secondary | ICD-10-CM | POA: Diagnosis not present

## 2020-09-10 DIAGNOSIS — Z79899 Other long term (current) drug therapy: Secondary | ICD-10-CM | POA: Insufficient documentation

## 2020-09-10 DIAGNOSIS — C541 Malignant neoplasm of endometrium: Secondary | ICD-10-CM | POA: Insufficient documentation

## 2020-09-10 DIAGNOSIS — D4989 Neoplasm of unspecified behavior of other specified sites: Secondary | ICD-10-CM | POA: Diagnosis not present

## 2020-09-10 DIAGNOSIS — E785 Hyperlipidemia, unspecified: Secondary | ICD-10-CM | POA: Insufficient documentation

## 2020-09-10 DIAGNOSIS — D252 Subserosal leiomyoma of uterus: Secondary | ICD-10-CM | POA: Diagnosis not present

## 2020-09-10 DIAGNOSIS — Z8249 Family history of ischemic heart disease and other diseases of the circulatory system: Secondary | ICD-10-CM | POA: Insufficient documentation

## 2020-09-10 HISTORY — PX: SENTINEL NODE BIOPSY: SHX6608

## 2020-09-10 HISTORY — PX: ROBOTIC ASSISTED TOTAL HYSTERECTOMY WITH BILATERAL SALPINGO OOPHERECTOMY: SHX6086

## 2020-09-10 LAB — TYPE AND SCREEN
ABO/RH(D): O POS
Antibody Screen: NEGATIVE

## 2020-09-10 LAB — ABO/RH: ABO/RH(D): O POS

## 2020-09-10 SURGERY — HYSTERECTOMY, TOTAL, ROBOT-ASSISTED, LAPAROSCOPIC, WITH BILATERAL SALPINGO-OOPHORECTOMY
Anesthesia: General

## 2020-09-10 MED ORDER — TRAMADOL HCL 50 MG PO TABS: 50.0000 mg | ORAL_TABLET | Freq: Four times a day (QID) | ORAL | Status: DC | PRN

## 2020-09-10 MED ORDER — ACETAMINOPHEN 325 MG PO TABS: 650.0000 mg | ORAL_TABLET | ORAL | Status: DC | PRN

## 2020-09-10 MED ORDER — ONDANSETRON HCL 4 MG/2ML IJ SOLN
INTRAMUSCULAR | Status: AC
  Filled 2020-09-10: qty 2

## 2020-09-10 MED ORDER — KETOROLAC TROMETHAMINE 30 MG/ML IJ SOLN
INTRAMUSCULAR | Status: AC
  Administered 2020-09-10: 30 mg via INTRAVENOUS
  Filled 2020-09-10: qty 1

## 2020-09-10 MED ORDER — HYDROMORPHONE HCL 1 MG/ML IJ SOLN
INTRAMUSCULAR | Status: AC
  Administered 2020-09-10: 0.5 mg via INTRAVENOUS
  Filled 2020-09-10: qty 1

## 2020-09-10 MED ORDER — SUGAMMADEX SODIUM 200 MG/2ML IV SOLN
INTRAVENOUS | Status: DC | PRN
  Administered 2020-09-10: 120 mg via INTRAVENOUS

## 2020-09-10 MED ORDER — GABAPENTIN 100 MG PO CAPS
200.0000 mg | ORAL_CAPSULE | ORAL | Status: AC
  Administered 2020-09-10: 200 mg via ORAL
  Filled 2020-09-10: qty 2

## 2020-09-10 MED ORDER — KETAMINE HCL 10 MG/ML IJ SOLN
INTRAMUSCULAR | Status: DC | PRN
  Administered 2020-09-10: 20 mg via INTRAVENOUS

## 2020-09-10 MED ORDER — KETOROLAC TROMETHAMINE 30 MG/ML IJ SOLN: 30.0000 mg | Freq: Once | INTRAMUSCULAR | Status: AC | PRN

## 2020-09-10 MED ORDER — BUPIVACAINE HCL 0.25 % IJ SOLN
INTRAMUSCULAR | Status: AC
  Filled 2020-09-10: qty 1

## 2020-09-10 MED ORDER — SCOPOLAMINE 1 MG/3DAYS TD PT72
1.0000 | MEDICATED_PATCH | TRANSDERMAL | Status: DC
  Administered 2020-09-10: 1.5 mg via TRANSDERMAL
  Filled 2020-09-10: qty 1

## 2020-09-10 MED ORDER — ONDANSETRON HCL 4 MG/2ML IJ SOLN
INTRAMUSCULAR | Status: DC | PRN
  Administered 2020-09-10: 4 mg via INTRAVENOUS

## 2020-09-10 MED ORDER — FENTANYL CITRATE (PF) 250 MCG/5ML IJ SOLN
INTRAMUSCULAR | Status: DC | PRN
  Administered 2020-09-10: 100 ug via INTRAVENOUS
  Administered 2020-09-10 (×2): 50 ug via INTRAVENOUS

## 2020-09-10 MED ORDER — LACTATED RINGERS IV SOLN: INTRAVENOUS | Status: DC

## 2020-09-10 MED ORDER — HYDROMORPHONE HCL 1 MG/ML IJ SOLN
0.2500 mg | INTRAMUSCULAR | Status: DC | PRN
  Administered 2020-09-10: 0.5 mg via INTRAVENOUS

## 2020-09-10 MED ORDER — PROPOFOL 10 MG/ML IV BOLUS
INTRAVENOUS | Status: DC | PRN
  Administered 2020-09-10: 150 mg via INTRAVENOUS

## 2020-09-10 MED ORDER — ROCURONIUM BROMIDE 10 MG/ML (PF) SYRINGE
PREFILLED_SYRINGE | INTRAVENOUS | Status: AC
  Filled 2020-09-10: qty 10

## 2020-09-10 MED ORDER — LIDOCAINE HCL (CARDIAC) PF 100 MG/5ML IV SOSY
PREFILLED_SYRINGE | INTRAVENOUS | Status: DC | PRN
  Administered 2020-09-10: 60 mg via INTRAVENOUS

## 2020-09-10 MED ORDER — BUPIVACAINE HCL 0.25 % IJ SOLN
INTRAMUSCULAR | Status: DC | PRN
  Administered 2020-09-10: 15 mL

## 2020-09-10 MED ORDER — OXYCODONE HCL 5 MG/5ML PO SOLN: 5.0000 mg | Freq: Once | ORAL | Status: AC | PRN

## 2020-09-10 MED ORDER — DEXAMETHASONE SODIUM PHOSPHATE 10 MG/ML IJ SOLN
INTRAMUSCULAR | Status: DC | PRN
  Administered 2020-09-10: 5 mg via INTRAVENOUS

## 2020-09-10 MED ORDER — STERILE WATER FOR INJECTION IJ SOLN
INTRAMUSCULAR | Status: DC | PRN
  Administered 2020-09-10: 9 mL

## 2020-09-10 MED ORDER — LACTATED RINGERS IR SOLN
Status: DC | PRN
  Administered 2020-09-10: 1000 mL

## 2020-09-10 MED ORDER — ORAL CARE MOUTH RINSE: 15.0000 mL | Freq: Once | OROMUCOSAL | Status: AC

## 2020-09-10 MED ORDER — SODIUM CHLORIDE 0.9% FLUSH: 3.0000 mL | INTRAVENOUS | Status: DC | PRN

## 2020-09-10 MED ORDER — STERILE WATER FOR INJECTION IJ SOLN
INTRAMUSCULAR | Status: DC | PRN
  Administered 2020-09-10: 1 mL via INTRAVENOUS

## 2020-09-10 MED ORDER — ONDANSETRON HCL 4 MG/2ML IJ SOLN
INTRAMUSCULAR | Status: AC
  Administered 2020-09-10: 4 mg via INTRAVENOUS
  Filled 2020-09-10: qty 2

## 2020-09-10 MED ORDER — KETAMINE HCL 10 MG/ML IJ SOLN
INTRAMUSCULAR | Status: AC
  Filled 2020-09-10: qty 1

## 2020-09-10 MED ORDER — MIDAZOLAM HCL 2 MG/2ML IJ SOLN
INTRAMUSCULAR | Status: DC | PRN
  Administered 2020-09-10: 1 mg via INTRAVENOUS

## 2020-09-10 MED ORDER — FENTANYL CITRATE (PF) 100 MCG/2ML IJ SOLN: 25.0000 ug | INTRAMUSCULAR | Status: DC | PRN

## 2020-09-10 MED ORDER — LIDOCAINE HCL (PF) 2 % IJ SOLN
INTRAMUSCULAR | Status: AC
  Filled 2020-09-10: qty 5

## 2020-09-10 MED ORDER — ACETAMINOPHEN 650 MG RE SUPP
650.0000 mg | RECTAL | Status: DC | PRN
  Filled 2020-09-10: qty 1

## 2020-09-10 MED ORDER — AMISULPRIDE (ANTIEMETIC) 5 MG/2ML IV SOLN
10.0000 mg | Freq: Once | INTRAVENOUS | Status: AC | PRN
  Administered 2020-09-10: 10 mg via INTRAVENOUS

## 2020-09-10 MED ORDER — ONDANSETRON HCL 4 MG/2ML IJ SOLN: 4.0000 mg | Freq: Once | INTRAMUSCULAR | Status: AC | PRN

## 2020-09-10 MED ORDER — PHENYLEPHRINE 40 MCG/ML (10ML) SYRINGE FOR IV PUSH (FOR BLOOD PRESSURE SUPPORT)
PREFILLED_SYRINGE | INTRAVENOUS | Status: DC | PRN
  Administered 2020-09-10 (×3): 80 ug via INTRAVENOUS

## 2020-09-10 MED ORDER — ENOXAPARIN SODIUM 40 MG/0.4ML ~~LOC~~ SOLN
40.0000 mg | SUBCUTANEOUS | Status: AC
  Administered 2020-09-10: 40 mg via SUBCUTANEOUS
  Filled 2020-09-10: qty 0.4

## 2020-09-10 MED ORDER — SODIUM CHLORIDE 0.9% FLUSH: 3.0000 mL | Freq: Two times a day (BID) | INTRAVENOUS | Status: DC

## 2020-09-10 MED ORDER — SODIUM CHLORIDE 0.9 % IV SOLN: 250.0000 mL | INTRAVENOUS | Status: DC | PRN

## 2020-09-10 MED ORDER — CHLORHEXIDINE GLUCONATE 0.12 % MT SOLN
15.0000 mL | Freq: Once | OROMUCOSAL | Status: AC
  Administered 2020-09-10: 15 mL via OROMUCOSAL

## 2020-09-10 MED ORDER — DEXAMETHASONE SODIUM PHOSPHATE 4 MG/ML IJ SOLN: 4.0000 mg | INTRAMUSCULAR | Status: DC

## 2020-09-10 MED ORDER — KETOROLAC TROMETHAMINE 30 MG/ML IJ SOLN
INTRAMUSCULAR | Status: AC
  Filled 2020-09-10: qty 1

## 2020-09-10 MED ORDER — LIDOCAINE 2% (20 MG/ML) 5 ML SYRINGE
INTRAMUSCULAR | Status: DC | PRN
  Administered 2020-09-10: 1.5 mg/kg/h via INTRAVENOUS

## 2020-09-10 MED ORDER — MIDAZOLAM HCL 2 MG/2ML IJ SOLN
INTRAMUSCULAR | Status: AC
  Filled 2020-09-10: qty 2

## 2020-09-10 MED ORDER — CEFAZOLIN SODIUM-DEXTROSE 2-4 GM/100ML-% IV SOLN
2.0000 g | INTRAVENOUS | Status: AC
  Administered 2020-09-10: 2 g via INTRAVENOUS
  Filled 2020-09-10: qty 100

## 2020-09-10 MED ORDER — OXYCODONE HCL 5 MG PO TABS: 5.0000 mg | ORAL_TABLET | Freq: Once | ORAL | Status: AC | PRN

## 2020-09-10 MED ORDER — OXYCODONE HCL 5 MG PO TABS
ORAL_TABLET | ORAL | Status: AC
  Administered 2020-09-10: 5 mg via ORAL
  Filled 2020-09-10: qty 1

## 2020-09-10 MED ORDER — ROCURONIUM BROMIDE 10 MG/ML (PF) SYRINGE
PREFILLED_SYRINGE | INTRAVENOUS | Status: DC | PRN
Start: 2020-09-10 — End: 2020-09-10
  Administered 2020-09-10: 40 mg via INTRAVENOUS

## 2020-09-10 MED ORDER — ACETAMINOPHEN 500 MG PO TABS
1000.0000 mg | ORAL_TABLET | ORAL | Status: AC
  Administered 2020-09-10: 1000 mg via ORAL
  Filled 2020-09-10: qty 2

## 2020-09-10 MED ORDER — STERILE WATER FOR INJECTION IJ SOLN
INTRAMUSCULAR | Status: AC
  Filled 2020-09-10: qty 10

## 2020-09-10 MED ORDER — AMISULPRIDE (ANTIEMETIC) 5 MG/2ML IV SOLN
INTRAVENOUS | Status: AC
  Filled 2020-09-10: qty 4

## 2020-09-10 MED ORDER — FENTANYL CITRATE (PF) 250 MCG/5ML IJ SOLN
INTRAMUSCULAR | Status: AC
  Filled 2020-09-10: qty 5

## 2020-09-10 MED ORDER — STERILE WATER FOR IRRIGATION IR SOLN
Status: DC | PRN
  Administered 2020-09-10: 1000 mL

## 2020-09-10 SURGICAL SUPPLY — 67 items
APPLICATOR SURGIFLO ENDO (HEMOSTASIS) IMPLANT
BACTOSHIELD CHG 4% 4OZ (MISCELLANEOUS) ×1
BAG LAPAROSCOPIC 12 15 PORT 16 (BASKET) IMPLANT
BAG RETRIEVAL 12/15 (BASKET)
BLADE SURG SZ10 CARB STEEL (BLADE) IMPLANT
CATH FOLEY 2WAY SLVR  5CC 14FR (CATHETERS) ×1
CATH FOLEY 2WAY SLVR 5CC 14FR (CATHETERS) ×2 IMPLANT
COVER BACK TABLE 60X90IN (DRAPES) ×3 IMPLANT
COVER TIP SHEARS 8 DVNC (MISCELLANEOUS) ×2 IMPLANT
COVER TIP SHEARS 8MM DA VINCI (MISCELLANEOUS) ×1
COVER WAND RF STERILE (DRAPES) IMPLANT
DECANTER SPIKE VIAL GLASS SM (MISCELLANEOUS) IMPLANT
DERMABOND ADVANCED (GAUZE/BANDAGES/DRESSINGS) ×1
DERMABOND ADVANCED .7 DNX12 (GAUZE/BANDAGES/DRESSINGS) ×2 IMPLANT
DRAPE ARM DVNC X/XI (DISPOSABLE) ×8 IMPLANT
DRAPE COLUMN DVNC XI (DISPOSABLE) ×2 IMPLANT
DRAPE DA VINCI XI ARM (DISPOSABLE) ×4
DRAPE DA VINCI XI COLUMN (DISPOSABLE) ×1
DRAPE SHEET LG 3/4 BI-LAMINATE (DRAPES) ×3 IMPLANT
DRAPE SURG IRRIG POUCH 19X23 (DRAPES) ×3 IMPLANT
DRSG OPSITE POSTOP 4X6 (GAUZE/BANDAGES/DRESSINGS) IMPLANT
DRSG OPSITE POSTOP 4X8 (GAUZE/BANDAGES/DRESSINGS) IMPLANT
ELECT PENCIL ROCKER SW 15FT (MISCELLANEOUS) IMPLANT
ELECT REM PT RETURN 15FT ADLT (MISCELLANEOUS) ×3 IMPLANT
GLOVE BIO SURGEON STRL SZ 6 (GLOVE) ×12 IMPLANT
GLOVE BIO SURGEON STRL SZ 6.5 (GLOVE) ×6 IMPLANT
GOWN STRL REUS W/ TWL LRG LVL3 (GOWN DISPOSABLE) ×8 IMPLANT
GOWN STRL REUS W/TWL LRG LVL3 (GOWN DISPOSABLE) ×4
HOLDER FOLEY CATH W/STRAP (MISCELLANEOUS) IMPLANT
IRRIG SUCT STRYKERFLOW 2 WTIP (MISCELLANEOUS) ×3
IRRIGATION SUCT STRKRFLW 2 WTP (MISCELLANEOUS) ×2 IMPLANT
KIT PROCEDURE DA VINCI SI (MISCELLANEOUS)
KIT PROCEDURE DVNC SI (MISCELLANEOUS) IMPLANT
KIT TURNOVER KIT A (KITS) IMPLANT
MANIPULATOR UTERINE 4.5 ZUMI (MISCELLANEOUS) ×3 IMPLANT
NEEDLE HYPO 22GX1.5 SAFETY (NEEDLE) ×3 IMPLANT
NEEDLE SPNL 18GX3.5 QUINCKE PK (NEEDLE) IMPLANT
OBTURATOR OPTICAL STANDARD 8MM (TROCAR) ×1
OBTURATOR OPTICAL STND 8 DVNC (TROCAR) ×2
OBTURATOR OPTICALSTD 8 DVNC (TROCAR) ×2 IMPLANT
PACK ROBOT GYN CUSTOM WL (TRAY / TRAY PROCEDURE) ×3 IMPLANT
PAD POSITIONING PINK XL (MISCELLANEOUS) ×3 IMPLANT
PORT ACCESS TROCAR AIRSEAL 12 (TROCAR) ×2 IMPLANT
PORT ACCESS TROCAR AIRSEAL 5M (TROCAR) ×1
POUCH SPECIMEN RETRIEVAL 10MM (ENDOMECHANICALS) IMPLANT
SCRUB CHG 4% DYNA-HEX 4OZ (MISCELLANEOUS) ×2 IMPLANT
SEAL CANN UNIV 5-8 DVNC XI (MISCELLANEOUS) ×6 IMPLANT
SEAL XI 5MM-8MM UNIVERSAL (MISCELLANEOUS) ×3
SET TRI-LUMEN FLTR TB AIRSEAL (TUBING) ×3 IMPLANT
SPONGE LAP 18X18 RF (DISPOSABLE) IMPLANT
SURGIFLO W/THROMBIN 8M KIT (HEMOSTASIS) IMPLANT
SUT MNCRL AB 4-0 PS2 18 (SUTURE) IMPLANT
SUT PDS AB 1 TP1 96 (SUTURE) IMPLANT
SUT VIC AB 0 CT1 27 (SUTURE)
SUT VIC AB 0 CT1 27XBRD ANTBC (SUTURE) IMPLANT
SUT VIC AB 2-0 CT1 27 (SUTURE)
SUT VIC AB 2-0 CT1 TAPERPNT 27 (SUTURE) IMPLANT
SUT VIC AB 4-0 PS2 18 (SUTURE) ×6 IMPLANT
SYR 10ML LL (SYRINGE) IMPLANT
SYR 20ML LL LF (SYRINGE) IMPLANT
SYR 50ML LL SCALE MARK (SYRINGE) IMPLANT
TOWEL OR NON WOVEN STRL DISP B (DISPOSABLE) ×3 IMPLANT
TRAP SPECIMEN MUCUS 40CC (MISCELLANEOUS) IMPLANT
TRAY FOLEY MTR SLVR 16FR STAT (SET/KITS/TRAYS/PACK) ×3 IMPLANT
TROCAR XCEL NON-BLD 5MMX100MML (ENDOMECHANICALS) IMPLANT
UNDERPAD 30X36 HEAVY ABSORB (UNDERPADS AND DIAPERS) ×3 IMPLANT
WATER STERILE IRR 1000ML POUR (IV SOLUTION) ×3 IMPLANT

## 2020-09-10 NOTE — Interval H&P Note (Signed)
History and Physical Interval Note:  09/10/2020 3:21 PM  Paula Newman  has presented today for surgery, with the diagnosis of ENDOMETRIAL CANCER.  The various methods of treatment have been discussed with the patient and family. After consideration of risks, benefits and other options for treatment, the patient has consented to  Procedure(s): XI ROBOTIC ASSISTED TOTAL HYSTERECTOMY WITH BILATERAL SALPINGO OOPHORECTOMY (Bilateral) SENTINEL NODE BIOPSY (N/A) as a surgical intervention.  The patient's history has been reviewed, patient examined, no change in status, stable for surgery.  I have reviewed the patient's chart and labs.  Questions were answered to the patient's satisfaction.     Thereasa Solo

## 2020-09-10 NOTE — Progress Notes (Signed)
Spoke with Dr. Denman George about patients level of pain and interventions already performed to alleviate pain. Dr. Denman George advised to get pt to attempt to void or do an in/out cath. Took pt to restroom but pt unable to void. Prior to attempting in/out cath, bladder scan done. 81mL urine in bladder so did not do the in/out cath. Per Dr. Denman George, pt ok to go home without voiding.

## 2020-09-10 NOTE — Op Note (Signed)
OPERATIVE NOTE 09/10/20  Surgeon: Donaciano Eva   Assistants: Dr Lahoma Crocker (an MD assistant was necessary for tissue manipulation, management of robotic instrumentation, retraction and positioning due to the complexity of the case and hospital policies).   Anesthesia: General endotracheal anesthesia  ASA Class: 2   Pre-operative Diagnosis: endometrial cancer grade 1  Post-operative Diagnosis: same,   Operation: Robotic-assisted laparoscopic total hysterectomy with bilateral salpingoophorectomy, SLN biopsy   Surgeon: Donaciano Eva  Assistant Surgeon: Lahoma Crocker MD  Anesthesia: GET  Urine Output: 150cc  Operative Findings:  : 6cm uterus, normal appearing tubes and ovaries, no gross extrauterine disease.  Estimated Blood Loss:  10cc      Total IV Fluids: 800 ml         Specimens: uterus, cervix, bilateral tubes and ovaries, right and left external iliac SLN         Complications:  None; patient tolerated the procedure well.         Disposition: PACU - hemodynamically stable.  Procedure Details  The patient was seen in the Holding Room. The risks, benefits, complications, treatment options, and expected outcomes were discussed with the patient.  The patient concurred with the proposed plan, giving informed consent.  The site of surgery properly noted/marked. The patient was identified as Paula Newman and the procedure verified as a Robotic-assisted hysterectomy with bilateral salpingo oophorectomy with SLN biopsy. A Time Out was held and the above information confirmed.  After induction of anesthesia, the patient was draped and prepped in the usual sterile manner. Pt was placed in supine position after anesthesia and draped and prepped in the usual sterile manner. The abdominal drape was placed after the CholoraPrep had been allowed to dry for 3 minutes.  Her arms were tucked to her side with all appropriate precautions.  The shoulders were  stabilized with padded shoulder blocks applied to the acromium processes.  The patient was placed in the semi-lithotomy position in [place].  The perineum was prepped with Betadine. The patient was then prepped. Foley catheter was placed.  A sterile speculum was placed in the vagina.  The cervix was grasped with a single-tooth tenaculum. 2mg  total of ICG was injected into the cervical stroma at 2 and 9 o'clock with 1cc injected at a 1cm and 49mm depth (concentration 0.5mg /ml) in all locations. The cervix was dilated with Kennon Rounds dilators.  The ZUMI uterine manipulator with a medium colpotomizer ring was placed without difficulty.  A pneum occluder balloon was placed over the manipulator.  OG tube placement was confirmed and to suction.   Next, a 5 mm skin incision was made 1 cm below the subcostal margin in the midclavicular line.  The 5 mm Optiview port and scope was used for direct entry.  Opening pressure was under 10 mm CO2.  The abdomen was insufflated and the findings were noted as above.   At this point and all points during the procedure, the patient's intra-abdominal pressure did not exceed 15 mmHg. Next, a 10 mm skin incision was made in the umbilicus and a right and left port was placed about 10 cm lateral to the robot port on the right and left side.The patient was placed in steep Trendelenburg.  Bowel was folded away into the upper abdomen.  The robot was docked in the normal manner.  The right and left peritoneum were opened parallel to the IP ligament to open the retroperitoneal spaces bilaterally. The SLN mapping was performed in bilateral pelvic basins.  The para rectal and paravesical spaces were opened up entirely with careful dissection below the level of the ureters bilaterally and to the depth of the uterine artery origin in order to skeletonize the uterine "web" and ensure visualization of all parametrial channels. The para-aortic basins were carefully exposed and evaluated for isolated  para-aortic SLN's. Lymphatic channels were identified travelling to the following visualized sentinel lymph node's: right and left external iliac. These SLN's were separated from their surrounding lymphatic tissue, removed and sent for permanent pathology.  The hysterectomy was started after the round ligament on the right side was incised and the retroperitoneum was entered and the pararectal space was developed.  The ureter was noted to be on the medial leaf of the broad ligament.  The peritoneum above the ureter was incised and stretched and the infundibulopelvic ligament was skeletonized, cauterized and cut.  The posterior peritoneum was taken down to the level of the KOH ring.  The anterior peritoneum was also taken down.  The bladder flap was created to the level of the KOH ring.  The uterine artery on the right side was skeletonized, cauterized and cut in the normal manner.  A similar procedure was performed on the left.  The colpotomy was made and the uterus, cervix, bilateral ovaries and tubes were amputated and delivered through the vagina.  Pedicles were inspected and excellent hemostasis was achieved.    The colpotomy at the vaginal cuff was closed with Vicryl on a CT1 needle in a running manner.  Irrigation was used and excellent hemostasis was achieved.  At this point in the procedure was completed.  Robotic instruments were removed under direct visulaization.  The robot was undocked. The 10 mm ports were closed with Vicryl on a UR-5 needle and the fascia was closed with 0 Vicryl on a UR-5 needle.  The skin was closed with 4-0 Vicryl in a subcuticular manner.  Dermabond was applied.  Sponge, lap and needle counts correct x 2.  The patient was taken to the recovery room in stable condition.  The vagina was swabbed with  minimal bleeding noted.   All instrument and needle counts were correct x  3.   The patient was transferred to the recovery room in stable condition.  Donaciano Eva,  MD

## 2020-09-10 NOTE — Anesthesia Procedure Notes (Signed)
Procedure Name: Intubation Date/Time: 09/10/2020 3:40 PM Performed by: Raenette Rover, CRNA Pre-anesthesia Checklist: Patient identified, Emergency Drugs available, Suction available and Patient being monitored Patient Re-evaluated:Patient Re-evaluated prior to induction Oxygen Delivery Method: Circle system utilized Preoxygenation: Pre-oxygenation with 100% oxygen Induction Type: IV induction Ventilation: Mask ventilation without difficulty Laryngoscope Size: Mac and 3 Grade View: Grade I Tube type: Oral Tube size: 7.0 mm Number of attempts: 1 Airway Equipment and Method: Stylet Placement Confirmation: ETT inserted through vocal cords under direct vision,  positive ETCO2 and breath sounds checked- equal and bilateral Secured at: 21 cm Tube secured with: Tape Dental Injury: Teeth and Oropharynx as per pre-operative assessment

## 2020-09-10 NOTE — Discharge Instructions (Signed)
Return to work: 4 weeks (2 weeks with physical restrictions).  Activity: 1. Be up and out of the bed during the day.  Take a nap if needed.  You may walk up steps but be careful and use the hand rail.  Stair climbing will tire you more than you think, you may need to stop part way and rest.   2. No lifting or straining for 4 weeks.  3. No driving for 1 weeks.  Do Not drive if you are taking narcotic pain medicine.  4. Shower daily.  Use soap and water on your incision and pat dry; don't rub.   5. No sexual activity and nothing in the vagina for 8 weeks.  Medications:  - Take ibuprofen and tylenol first line for pain control. Take these regularly (every 6 hours) to decrease the build up of pain.  - If necessary, for severe pain not relieved by ibuprofen, contact Dr Serita Grit office and you will be prescribed percocet.  - While taking percocet you should take sennakot every night to reduce the likelihood of constipation. If this causes diarrhea, stop its use.  Diet: 1. Low sodium Heart Healthy Diet is recommended.  2. It is safe to use a laxative if you have difficulty moving your bowels.   Wound Care: 1. Keep clean and dry.  Shower daily.  Reasons to call the Doctor:   Fever - Oral temperature greater than 100.4 degrees Fahrenheit  Foul-smelling vaginal discharge  Difficulty urinating  Nausea and vomiting  Increased pain at the site of the incision that is unrelieved with pain medicine.  Difficulty breathing with or without chest pain  New calf pain especially if only on one side  Sudden, continuing increased vaginal bleeding with or without clots.   Follow-up: 1. See Everitt Amber in 4 weeks.  Contacts: For questions or concerns you should contact:  Dr. Everitt Amber at [phone] After hours and on week-ends call [phone] and ask to speak to the physician on call for Gynecologic Oncology  After Your Surgery  The information in this section will tell you what  to expect after your surgery, both during your stay and after you leave. You will learn how to safely recover from your surgery. Write down any questions you have and be sure to ask your doctor or nurse.  What to Expect When you wake up after your surgery, you will be in the [facility] (PACU) or your recovery room. A nurse will be monitoring your body temperature, blood pressure, pulse, and oxygen levels. You may have a urinary catheter in your bladder to help monitor the amount of urine you are making. It should come out before you go home. You will also have compression boots on your lower legs to help your circulation. Your pain medication will be given through an IV line or in tablet form. If you are having pain, tell your nurse. Your nurse will tell you how to recover from your surgery. Below are examples of ways you can help yourself recover safely. . You will be encouraged to walk with the help of your nurse or physical therapist. We will give you medication to relieve pain. Walking helps reduce the risk for blood clots and pneumonia. It also helps to stimulate your bowels so they begin working again. . Use your incentive spirometer. This will help your lungs expand, which prevents pneumonia.   Commonly Asked Questions  Will I have pain after surgery? Yes, you will have some  pain after your surgery, especially in the first few days. Your doctor and nurse will ask you about your pain often. You will be given medication to manage your pain as needed. If your pain is not relieved, please tell your doctor or nurse. It is important to control your pain so you can cough, breathe deeply, use your incentive spirometer, and get out of bed and walk.  Will I be able to eat? Yes, you will be able to eat a regular diet or eat as tolerated. You should start with foods that are soft and easy to digest such as apple sauce and chicken noodle soup. Eat small meals frequently, and then advance  to regular foods. If you experience bloating, gas, or cramps, limit high-fiber foods, including whole grain breads and cereal, nuts, seeds, salads, fresh fruit, broccoli, cabbage, and cauliflower. Will I have pain when I am home? The length of time each person has pain or discomfort varies. You may still have some pain when you go home and will probably be taking pain medication. Follow the guidelines below. . Take your medications as directed and as needed. . Call your doctor if the medication prescribed for you doesn't relieve your pain. . Don't drive or drink alcohol while you're taking prescription pain medication. . As your incision heals, you will have less pain and need less pain medication. A mild pain reliever such as acetaminophen (Tylenol) or ibuprofen (Advil) will relieve aches and discomfort. However, large quantities of acetaminophen may be harmful to your liver. Don't take more acetaminophen than the amount directed on the bottle or as instructed by your doctor or nurse. . Pain medication should help you as you resume your normal activities. Take enough medication to do your exercises comfortably. Pain medication is most effective 30 to 45 minutes after taking it. Marland Kitchen Keep track of when you take your pain medication. Taking it when your pain first begins is more effective than waiting for the pain to get worse. Pain medication may cause constipation (having fewer bowel movements than what is normal for you).  How can I prevent constipation? . Go to the bathroom at the same time every day. Your body will get used to going at that time. . If you feel the urge to go, don't put it off. Try to use the bathroom 5 to 15 minutes after meals. . After breakfast is a good time to move your bowels. The reflexes in your colon are strongest at this time. . Exercise, if you can. Walking is an excellent form of exercise. . Drink 8 (8-ounce) glasses (2 liters) of liquids daily, if you can. Drink  water, juices, soups, ice cream shakes, and other drinks that don't have caffeine. Drinks with caffeine, such as coffee and soda, pull fluid out of the body. . Slowly increase the fiber in your diet to 25 to 35 grams per day. Fruits, vegetables, whole grains, and cereals contain fiber. If you have an ostomy or have had recent bowel surgery, check with your doctor or nurse before making any changes in your diet. . Both over-the-counter and prescription medications are available to treat constipation. Start with 1 of the following over-the-counter medications first: o Docusate sodium (Colace) 100 mg. Take ___1__ capsules _2____ times a day. This is a stool softener that causes few side effects. Don't take it with mineral oil. o Polyethylene glycol (MiraLAX) 17 grams daily. o Senna (Senokot) 2 tablets at bedtime. This is a stimulant laxative, which can cause cramping. Marland Kitchen  If you haven't had a bowel movement in 2 days, call your doctor or nurse.  Can I shower? Yes, you should shower 24 hours after your surgery. Be sure to shower every day. Taking a warm shower is relaxing and can help decrease muscle aches. Use soap when you shower and gently wash your incision. Pat the areas dry with a towel after showering, and leave your incision uncovered (unless there is drainage). Call your doctor if you see any redness or drainage from your incision. Don't take tub baths until you discuss it with your doctor at the first appointment after your surgery. How do I care for my incisions? You will have several small incisions on your abdomen. The incisions are closed with Steri-Strips or Dermabond. You may also have square white dressings on your incisions (Primapore). You can remove these in the shower 24 hours after your surgery. You should clean your incisions with soap and water. If you go home with Steri-Strips on your incision, they will loosen and may fall off by themselves. If they haven't fallen off within 10  days, you can remove them. If you go home with Dermabond over your sutures (stitches), it will also loosen and peel off.  What are the most common symptoms after a hysterectomy? It's common for you to have some vaginal spotting or light bleeding. You should monitor this with a pad or a panty liner. If you have having heavy bleeding (bleeding through a pad or liner every 1 to 2 hours), call your doctor right away. It's also common to have some discomfort after surgery from the air that was pumped into your abdomen during surgery. To help with this, walk, drink plenty of liquids and make sure to take the stool softeners you received.  When is it safe for me to drive? You may resume driving 2 weeks after surgery, as long as you are not taking pain medication that may make you drowsy.  When can I resume sexual activity? Do not place anything in your vagina or have vaginal intercourse for 8 weeks after your surgery. Some people will need to wait longer than 8 weeks, so speak with your doctor before resuming sexual intercourse.  Will I be able to travel? Yes, you can travel. If you are traveling by plane within a few weeks after your surgery, make sure you get up and walk every hour. Be sure to stretch your legs, drink plenty of liquids, and keep your feet elevated when possible.  Will I need any supplies? Most people do not need any supplies after the surgery. In the rare case that you do need supplies, such as tubes or drains, your nurse will order them for you.  When can I return to work? The time it takes to return to work depends on the type of work you do, the type of surgery you had, and how fast your body heals. Most people can return to work about 2 to 4 weeks after the surgery.  What exercises can I do? Exercise will help you gain strength and feel better. Walking and stair climbing are excellent forms of exercise. Gradually increase the distance you walk. Climb stairs slowly, resting or  stopping as needed. Ask your doctor or nurse before starting more strenuous exercises.  When can I lift heavy objects? Most people should not lift anything heavier than 10 pounds (4.5 kilograms) for at least 4 weeks after surgery. Speak with your doctor about when you can do heavy lifting.  How can I cope with my feelings? After surgery for a serious illness, you may have new and upsetting feelings. Many people say they felt weepy, sad, worried, nervous, irritable, and angry at one time or another. You may find that you can't control some of these feelings. If this happens, it's a good idea to seek emotional support. The first step in coping is to talk about how you feel. Family and friends can help. Your nurse, doctor, and social worker can reassure, support, and guide you. It's always a good idea to let these professionals know how you, your family, and your friends are feeling emotionally. Many resources are available to patients and their families. Whether you're in the hospital or at home, the nurses, doctors, and social workers are here to help you and your family and friends handle the emotional aspects of your illness.  When is my first appointment after surgery? Your first appointment after surgery will be 2 to 4 weeks after surgery. Your nurse will give you instructions on how to make this appointment, including the phone number to call.  What if I have other questions? If you have any questions or concerns, please talk with your doctor or nurse. You can reach them Monday through Friday from 9:00 am to 5:00 pm. After 5:00 pm, during the weekend, and on holidays, call [phone] and ask for the doctor on call for your doctor.  . Have a temperature of 101 F (38.3 C) or higher . Have pain that does not get better with pain medication . Have redness, drainage, or swelling from your incisions

## 2020-09-10 NOTE — Anesthesia Postprocedure Evaluation (Signed)
Anesthesia Post Note  Patient: Paula Newman  Procedure(s) Performed: XI ROBOTIC ASSISTED TOTAL HYSTERECTOMY WITH BILATERAL SALPINGO OOPHORECTOMY (Bilateral ) SENTINEL NODE BIOPSY (N/A )     Patient location during evaluation: PACU Anesthesia Type: General Level of consciousness: awake and alert Pain management: pain level controlled Vital Signs Assessment: post-procedure vital signs reviewed and stable Respiratory status: spontaneous breathing, nonlabored ventilation and respiratory function stable Cardiovascular status: blood pressure returned to baseline and stable Postop Assessment: no apparent nausea or vomiting Anesthetic complications: no   No complications documented.  Last Vitals:  Vitals:   09/10/20 1900 09/10/20 1915  BP: 126/73 126/73  Pulse:  80  Resp: 16   Temp:    SpO2: 98% 95%    Last Pain:  Vitals:   09/10/20 1900  TempSrc:   PainSc: 3                  Lidia Collum

## 2020-09-10 NOTE — Transfer of Care (Signed)
Immediate Anesthesia Transfer of Care Note  Patient: Paula Newman  Procedure(s) Performed: XI ROBOTIC ASSISTED TOTAL HYSTERECTOMY WITH BILATERAL SALPINGO OOPHORECTOMY (Bilateral ) SENTINEL NODE BIOPSY (N/A )  Patient Location: PACU  Anesthesia Type:General  Level of Consciousness: drowsy and patient cooperative  Airway & Oxygen Therapy: Patient Spontanous Breathing and Patient connected to face mask oxygen  Post-op Assessment: Report given to RN and Post -op Vital signs reviewed and stable  Post vital signs: Reviewed and stable  Last Vitals:  Vitals Value Taken Time  BP 137/83 09/10/20 1709  Temp    Pulse 80 09/10/20 1711  Resp 15 09/10/20 1711  SpO2 93 % 09/10/20 1711  Vitals shown include unvalidated device data.  Last Pain:  Vitals:   09/10/20 1218  TempSrc:   PainSc: 0-No pain      Patients Stated Pain Goal: 4 (82/99/37 1696)  Complications: No complications documented.

## 2020-09-11 ENCOUNTER — Telehealth: Payer: Self-pay

## 2020-09-11 ENCOUNTER — Encounter (HOSPITAL_COMMUNITY): Payer: Self-pay | Admitting: Gynecologic Oncology

## 2020-09-11 NOTE — Telephone Encounter (Signed)
[name] states that she is eating,drinking, and urinating well. She has intermittent nausea but able to eat. No vomiting. She is experiencing some burning with urination. The burning has decreased since last night. Told her to call the office Friday 09-13-20 is she is still experiencing urinary burning as she may need to come into the office to get a U/A C&S to see if she has a UTI. Passing gas this am.  Told her to begin the senokot-s 2 tabs with 8 oz of water bid until good BM.  If no BM by midday Friday, she can add a capful of Miralax bid. Afebrile. Temp. 97.6 this am Incisions D&I. Pain controled with current medications. Pt aware of post op appointments as well as the office number [phone] and after hours number [phone] to call if she has any questions or concerns.

## 2020-09-16 ENCOUNTER — Encounter: Payer: Self-pay | Admitting: Gynecologic Oncology

## 2020-09-16 NOTE — Telephone Encounter (Signed)
Instructed [name] begin using 1 capful of  Miralax bid in addition to the senokot-s.  If no good evacuation by tomorrow ~1 pm, she can take a 1/2 bottle of Mag citrate.  If no BM after 4 hours she can take the second half.  She can call the office Wednesday if still having difficulty with BM.  Told her that her abdomen will be swollen and puffy after the surgery. If will decrease over time.  The constipation is  contributing to abdominal distension. Pt verbalized understanding.

## 2020-09-17 ENCOUNTER — Encounter: Payer: Self-pay | Admitting: Gynecologic Oncology

## 2020-09-17 NOTE — Telephone Encounter (Signed)
Paula Newman states that she is passing small amounts of gas. No BM after Miralax as noted from yesterday. Instructed her to proceed with the 1/ bottle of mag citrate as discussed yesterday as noted below. Pt verbalized understanding.

## 2020-09-18 ENCOUNTER — Encounter: Payer: Self-pay | Admitting: Gynecologic Oncology

## 2020-09-18 ENCOUNTER — Inpatient Hospital Stay: Payer: Medicare Other | Attending: Gynecologic Oncology | Admitting: Gynecologic Oncology

## 2020-09-18 DIAGNOSIS — Z90722 Acquired absence of ovaries, bilateral: Secondary | ICD-10-CM

## 2020-09-18 DIAGNOSIS — Z90710 Acquired absence of both cervix and uterus: Secondary | ICD-10-CM | POA: Insufficient documentation

## 2020-09-18 DIAGNOSIS — C541 Malignant neoplasm of endometrium: Secondary | ICD-10-CM

## 2020-09-18 DIAGNOSIS — Z7189 Other specified counseling: Secondary | ICD-10-CM

## 2020-09-18 NOTE — Progress Notes (Signed)
Gynecologic Oncology Telehealth Follow-up Note  I connected with Paula Newman on 09/18/20 at  3:00 PM EST by telephone and verified that I am speaking with the correct person using two identifiers.  I discussed the limitations, risks, security and privacy concerns of performing an evaluation and management service by telemedicine and the availability of in-person appointments. I also discussed with the patient that there may be a patient responsible charge related to this service. The patient expressed understanding and agreed to proceed.  Other persons participating in the visit and their role in the encounter: husband, listening.  Patient's location: home Provider's location: [place]  Chief Complaint:  Chief Complaint  Patient presents with  . endometrial cancer    Assessment/Plan:  [name]  is a 69 y.o.  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  High/intermediate risk factors for recurrence. Recommendation is for external beam radiation and vaginal brachytherapy to reduce risk for local recurrence in accordance with NCCN guidelines.  We will order a CT scan chest/abd/pelvis for staging purposes.   I discussed this with the patient. I discussed the role of adjuvant therapy. I discussed prognosis and risk for recurrence. We reviewed symptoms concerning for recurrence and she will see me if these develop prior to her scheduled appointment.  After completing adjuvant therapy I recommend she follow-up at 3 monthly intervals for symptom review, physical examination and pelvic examination. Pap smear is not recommended in routine endometrial cancer surveillance. After 2 years we will space these visits to every 6 months, and then annually if recurrence has not developed within 5 years. All questions were answered.  HPI: [name]  is a 70 year old P 1 who was seen in consultation at the request of Dr Ulanda Edison for evaluation and treatment of grade 1 endometrial cancer.   Her symptoms began in October 2021 with vaginal bleeding which was postmenopausal.  She saw her primary care physician for a symptom related visit who referred her to see Dr. Ulanda Edison (she did not have a gynecologist that she received GYN care from her PCP) on August 07, 2020.  Work-up of symptoms included a transvaginal ultrasound and endometrial Pipelle and Pap smear. Transvaginal US on July 30, 2020 showed a uterus measuring 7.7 x 3.9 x 5.3 cm with an endometrial thickness of 23 mm. 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 Pap testing was normal on 08/07/2020.  Interval Hx:  On 09/10/20 she underwent robotic assisted total hysterectomy, BSO, SLN biopsy. Intraoperative findings were significant for a 6cm normal appearing uterus with normal tubes and ovaries and no gross extrauterine disease. Surgery was uncomplicated.  Final pathology revealed a FIGO grade 2 endometrioid endometrial adenocarcinoma with deep myometrial invasion (25mm of 82mm thickness) with LVSI present. The adnexa and cervix were free of tumor. However, there were isolated tumor cells in both SLN's. MMR testing was pending. This was staged as a stage IB grade 2 endometrial cancer with high/intermediate risk factors for recurrence and adjuvant radiation was recommended in accordance with NCCN guidelines.  Since surgery she has had constipation relieved with magnesium citrate.   Current Meds:  Outpatient Encounter Medications as of 09/18/2020  Medication Sig  . Dextromethorphan Polistirex (DELSYM PO) Take 10 mLs by mouth daily as needed (cough).  . ezetimibe (ZETIA) 10 MG tablet  Take 10 mg by mouth at bedtime.   Marland Kitchen ibuprofen (ADVIL) 600 MG tablet Take 1 tablet (600 mg total) by mouth  every 6 (six) hours as needed for moderate pain. For AFTER surgery only  . promethazine-dextromethorphan (PROMETHAZINE-DM) 6.25-15 MG/5ML syrup Take 5 mLs by mouth every 6 (six) hours as needed for cough.  . pseudoephedrine (SUDAFED) 30 MG tablet Take 30 mg by mouth 2 (two) times daily as needed for congestion.  . senna-docusate (SENOKOT-S) 8.6-50 MG tablet Take 2 tablets by mouth at bedtime. For AFTER surgery, do not take if having diarrhea  . simvastatin (ZOCOR) 40 MG tablet Take 20 mg by mouth at bedtime.   . sodium chloride (OCEAN) 0.65 % SOLN nasal spray Place 1 spray into both nostrils every 6 (six) hours as needed for congestion.  . traMADol (ULTRAM) 50 MG tablet Take 1 tablet (50 mg total) by mouth every 6 (six) hours as needed for severe pain. For AFTER surgery only, do not take and drive   No facility-administered encounter medications on file as of 09/18/2020.    Allergy:  Allergies  Allergen Reactions  . Coconut Oil Nausea And Vomiting  . Codeine Nausea And Vomiting    Social Hx:   Social History   Socioeconomic History  . Marital status: Married    Spouse name: Not on file  . Number of children: 1  . Years of education: Not on file  . Highest education level: Not on file  Occupational History  . Not on file  Tobacco Use  . Smoking status: Never Smoker  . Smokeless tobacco: Never Used  Vaping Use  . Vaping Use: Never used  Substance and Sexual Activity  . Alcohol use: Not Currently    Comment: rare occasion   2 glasses a year  . Drug use: Never  . Sexual activity: Not Currently    Birth control/protection: None  Other Topics Concern  . Not on file  Social History Narrative  . Not on file   Social Determinants of Health   Financial Resource Strain:   . Difficulty of Paying Living Expenses: Not on file  Food Insecurity:   . Worried About Charity fundraiser in the Last Year: Not on file  . Ran Out of Food in the Last Year: Not on file  Transportation  Needs:   . Lack of Transportation (Medical): Not on file  . Lack of Transportation (Non-Medical): Not on file  Physical Activity:   . Days of Exercise per Week: Not on file  . Minutes of Exercise per Session: Not on file  Stress:   . Feeling of Stress : Not on file  Social Connections:   . Frequency of Communication with Friends and Family: Not on file  . Frequency of Social Gatherings with Friends and Family: Not on file  . Attends Religious Services: Not on file  . Active Member of Clubs or Organizations: Not on file  . Attends Archivist Meetings: Not on file  . Marital Status: Not on file  Intimate Partner Violence:   . Fear of Current or Ex-Partner: Not on file  . Emotionally Abused: Not on file  . Physically Abused: Not on file  . Sexually Abused: Not on file    Past Surgical Hx:  Past Surgical History:  Procedure Laterality Date  . AUGMENTATION MAMMAPLASTY    . BREAST EXCISIONAL BIOPSY Bilateral   . MENISCUS REPAIR Bilateral   . ROBOTIC ASSISTED TOTAL HYSTERECTOMY WITH BILATERAL SALPINGO OOPHERECTOMY  Bilateral 09/10/2020   Procedure: XI ROBOTIC ASSISTED TOTAL HYSTERECTOMY WITH BILATERAL SALPINGO OOPHORECTOMY;  Surgeon: Everitt Amber, MD;  Location: [facility];  Service: Gynecology;  Laterality: Bilateral;  . SENTINEL NODE BIOPSY N/A 09/10/2020   Procedure: SENTINEL NODE BIOPSY;  Surgeon: Everitt Amber, MD;  Location: [facility];  Service: Gynecology;  Laterality: N/A;    Past Medical Hx:  Past Medical History:  Diagnosis Date  . Cancer ([place])    endometrial  . Complication of anesthesia    slow to wake up  . Hyperlipidemia     Past Gynecological History:  See HPI, svd x 1, no hx of abnormal paps No LMP recorded. Patient is postmenopausal.  Family Hx:  Family History  Problem Relation Age of Onset  . Hypertension Mother   . Hypertension Father   . Breast cancer Sister 75  . Breast cancer Paternal Grandmother     Review of Systems:  Constitutional  Feels  well,    ENT Normal appearing ears and nares bilaterally Skin/Breast  No rash, sores, jaundice, itching, dryness Cardiovascular  No chest pain, shortness of breath, or edema  Pulmonary  No cough or wheeze.  Gastro Intestinal  + constipation.  Genito Urinary  No frequency, urgency, dysuria, no bleeding Musculo Skeletal  No myalgia, arthralgia, joint swelling or pain  Neurologic  No weakness, numbness, change in gait,  Psychology  No depression, anxiety, insomnia.   Vitals:  There were no vitals taken for this visit.  Physical Exam: deferred  I discussed the assessment and treatment plan with the patient. The patient was provided with an opportunity to ask questions and all were answered. The patient agreed with the plan and demonstrated an understanding of the instructions.   The patient was advised to call back or see an in-person evaluation if the symptoms worsen or if the condition fails to improve as anticipated.   I provided 30 minutes of non face-to-face telephone visit time during this encounter, and > 50% was spent counseling as documented under my assessment & plan.   Thereasa Solo, MD  09/18/2020, 3:11 PM

## 2020-09-19 ENCOUNTER — Encounter: Payer: Self-pay | Admitting: Oncology

## 2020-09-19 DIAGNOSIS — C541 Malignant neoplasm of endometrium: Secondary | ICD-10-CM

## 2020-09-19 LAB — SURGICAL PATHOLOGY

## 2020-09-25 NOTE — Progress Notes (Signed)
GYN Location of Tumor / Histology: Stage IB grade 2 endometrioid endometrial cancer with high risk features (deep myometrial invasion, LVSI present and isolated tumor cells)  Paula Newman's symptoms: began in October 2021 with vaginal bleeding which was postmenopausal.  She saw her primary care physician for a symptom related visit who referred her to see Dr. Ulanda Edison on August 07, 2020 (received GYN care from her PCP previously) . Work-up of symptoms included a transvaginal ultrasound and endometrial Pipelle and Pap smear. Transvaginal US on July 30, 2020 showed a uterus measuring 7.7 x 3.9 x 5.3 cm with an endometrial thickness of 23 mm. 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 Pap testing was normal on 08/07/2020.  Biopsies revealed:  09/10/2020 FINAL MICROSCOPIC DIAGNOSIS:  A. SENTINEL LYMPH NODE, RIGHT EXTERNAL ILIAC, EXCISION:  - One with node with isolated tumor cells.  - See comment.  B. SENTINEL LYMPH NODE, LEFT EXTERNAL ILIAC, EXCISION:  - One with node with isolated tumor cells.  - See comment.  C. UTERUS, CERVIX, BILATERAL TUBES AND OVARIES:    - Uterus:      Endometrioid adenocarcinoma, 7.5 cm.      Carcinoma involves subserosal myometrium.      No serosal involvement identified.      Focal lymphovascular space involvement by tumor.      Cervix, bilateral ovaries and bilateral fallopian tubes          free of tumor.   Past/Anticipated interventions by Gyn/Onc surgery, if any:  09/10/2020 Dr. Everitt Amber Robotic-assisted laparoscopic total hysterectomy with bilateral salpingoophorectomy, SLN biopsy   Past/Anticipated interventions by medical oncology, if any: No referral placed at this time  Weight changes, if any: down 4-5 lb since surgery  Bowel/Bladder complaints, if any: Reports taking Miralax to regulate bowels. Dysuria resolved.  Denies other LUTS. Denies vaginal odor, itching, bleeding or discharge.    Nausea/Vomiting, if any: denies  Pain issues, if any:  denies  SAFETY ISSUES:  Prior radiation? denies  Pacemaker/ICD? denies  Possible current pregnancy? No--hysterectomy 09/10/20  Is the patient on methotrexate? denies  Current Complaints / other details:  68 year old female. Accompanied by her husband, Paula Newman. Denies headache, dizziness, diplopia or tinnitus. Steady gait noted. Denies recent falls.   BP 109/70 (BP Location: Left Arm, Patient Position: Sitting)   Pulse 88   Temp (!) 96.8 F (36 C) (Temporal)   Resp 18   Ht 5\' 3"  (1.6 m)   Wt 124 lb 4 oz (56.4 kg)   SpO2 100%   BMI 22.01 kg/m  Wt Readings from Last 3 Encounters:  09/26/20 124 lb 4 oz (56.4 kg)  09/10/20 129 lb (58.5 kg)  09/03/20 129 lb (58.5 kg)

## 2020-09-26 ENCOUNTER — Ambulatory Visit
Admission: RE | Admit: 2020-09-26 | Discharge: 2020-09-26 | Disposition: A | Discharge status: Home / Self Care | Payer: Medicare Other | Source: Ambulatory Visit | Attending: Radiation Oncology | Admitting: Radiation Oncology

## 2020-09-26 ENCOUNTER — Other Ambulatory Visit: Payer: Self-pay

## 2020-09-26 ENCOUNTER — Encounter: Payer: Self-pay | Admitting: Radiation Oncology

## 2020-09-26 VITALS — BP 109/70 | HR 88 | Temp 96.8°F | Resp 18 | Ht 63.0 in | Wt 124.2 lb

## 2020-09-26 DIAGNOSIS — C541 Malignant neoplasm of endometrium: Secondary | ICD-10-CM | POA: Diagnosis not present

## 2020-09-26 DIAGNOSIS — R9389 Abnormal findings on diagnostic imaging of other specified body structures: Secondary | ICD-10-CM | POA: Diagnosis not present

## 2020-09-26 DIAGNOSIS — Z79899 Other long term (current) drug therapy: Secondary | ICD-10-CM | POA: Insufficient documentation

## 2020-09-26 DIAGNOSIS — E785 Hyperlipidemia, unspecified: Secondary | ICD-10-CM | POA: Insufficient documentation

## 2020-09-26 DIAGNOSIS — Z8542 Personal history of malignant neoplasm of other parts of uterus: Secondary | ICD-10-CM | POA: Insufficient documentation

## 2020-09-26 DIAGNOSIS — Z803 Family history of malignant neoplasm of breast: Secondary | ICD-10-CM | POA: Diagnosis not present

## 2020-09-26 DIAGNOSIS — N951 Menopausal and female climacteric states: Secondary | ICD-10-CM | POA: Insufficient documentation

## 2020-09-26 DIAGNOSIS — M810 Age-related osteoporosis without current pathological fracture: Secondary | ICD-10-CM | POA: Insufficient documentation

## 2020-09-26 NOTE — Progress Notes (Signed)
Radiation Oncology         [phone] ________________________________  Initial Outpatient Consultation  Name: Paula Newman MRN: [id number]  Date: 09/26/2020  DOB: 06/23/1951  CC:Sun, Paula Crown, MD  Newman Amber, MD   REFERRING PHYSICIAN: Everitt Amber, MD  DIAGNOSIS: The encounter diagnosis was Endometrial cancer [facility]).  Stage IB, grade 2, endometrioid endometrial cancer with high risk features (deep myometrial invasion, LVSI, and isolated tumor cells)  HISTORY OF PRESENT ILLNESS::Paula Newman is a 69 y.o. female who is seen as a courtesy of Dr. Denman Newman for an opinion concerning radiation therapy as part of management for her recently diagnosed endometrial cancer. Today, she is accompanied by her husband. The patient presented to her PCP in early October 2021 for evaluation of postmenopausal bleeding. Transabdominal and transvaginal ultrasound of the pelvis on 07/30/2020 showed a thickened endometrium; differentials included submucosal fibroid vs endometrial poly vs endometrial hyperplasia vs malignancy. The patient was then referred to Dr. Ulanda Newman, [place], and was seen on 08/07/2020. At that time, an endometrial Pipelle was performed and revealed FIGO grade 1-2 endometrioid endometrial adenocarcinoma. Pap testing was normal.   Given the above findings, the patient was referred to Dr. Denman Newman and underwent a robotic-assisted laparoscopic total hysterectomy with bilateral salpingo-oophorectomy and sentinel lymph node biopsy on 09/10/2020. Pathology from the procedure revealed endometrioid adenocarcinoma that spanned 7.5 cm and involved subserosal myometrium. There was no serosal involvement identified. However, there was focal lymphovascular space involvement by tumor. The cervix, bilateral ovaries, and bilateral fallopian tubes were free of tumor. One right external iliac sentinel lymph node was excised and one left external iliac sentinel lymph node was excised, both of which showed isolated  tumor cells.  The patient was last seen by Dr. Denman Newman on 09/18/2020, during which time it was recommended that she proceed with external beam radiation and vaginal brachytherapy to reduce risk of local recurrence.  PREVIOUS RADIATION THERAPY: No  PAST MEDICAL HISTORY:  Past Medical History:  Diagnosis Date  . Cancer ([place])    endometrial  . Complication of anesthesia    slow to wake up  . Hyperlipidemia     PAST SURGICAL HISTORY: Past Surgical History:  Procedure Laterality Date  . AUGMENTATION MAMMAPLASTY    . BREAST EXCISIONAL BIOPSY Bilateral   . MENISCUS REPAIR Bilateral   . ROBOTIC ASSISTED TOTAL HYSTERECTOMY WITH BILATERAL SALPINGO OOPHERECTOMY Bilateral 09/10/2020   Procedure: XI ROBOTIC ASSISTED TOTAL HYSTERECTOMY WITH BILATERAL SALPINGO OOPHORECTOMY;  Surgeon: Newman Amber, MD;  Location: [facility];  Service: Gynecology;  Laterality: Bilateral;  . SENTINEL NODE BIOPSY N/A 09/10/2020   Procedure: SENTINEL NODE BIOPSY;  Surgeon: Newman Amber, MD;  Location: [facility];  Service: Gynecology;  Laterality: N/A;    FAMILY HISTORY:  Family History  Problem Relation Age of Onset  . Hypertension Mother   . Hypertension Father   . Breast cancer Sister 56  . Breast cancer Paternal Grandmother     SOCIAL HISTORY:  Social History   Tobacco Use  . Smoking status: Never Smoker  . Smokeless tobacco: Never Used  Vaping Use  . Vaping Use: Never used  Substance Use Topics  . Alcohol use: Not Currently    Comment: rare occasion   2 glasses a year  . Drug use: Never    ALLERGIES:  Allergies  Allergen Reactions  . Coconut Oil Nausea And Vomiting  . Codeine Nausea And Vomiting    MEDICATIONS:  Current Outpatient Medications  Medication Sig Dispense Refill  . ezetimibe (ZETIA) 10  MG tablet 1/2 tablet    . polyethylene glycol (MIRALAX / GLYCOLAX) 17 g packet Take 17 g by mouth daily.    . pseudoephedrine (SUDAFED) 30 MG tablet Take 30 mg by mouth 2 (two) times daily as needed for  congestion.    . simvastatin (ZOCOR) 20 MG tablet 1/2 tablet    . sodium chloride (OCEAN) 0.65 % SOLN nasal spray Place 1 spray into both nostrils every 6 (six) hours as needed for congestion.     No current facility-administered medications for this encounter.    REVIEW OF SYSTEMS:  A 10+ POINT REVIEW OF SYSTEMS WAS OBTAINED including neurology, dermatology, psychiatry, cardiac, respiratory, lymph, extremities, GI, GU, musculoskeletal, constitutional, reproductive, HEENT. She has had some mild problems with constipation since her surgery. She denies any vaginal bleeding or vaginal discharge   PHYSICAL EXAM:  height is 5\' 3"  (1.6 m) and weight is 124 lb 4 oz (56.4 kg). Her temporal temperature is 96.8 F (36 C) (abnormal). Her blood pressure is 109/70 and her pulse is 88. Her respiration is 18 and oxygen saturation is 100%.   General: Alert and oriented, in no acute distress HEENT: Head is normocephalic. Extraocular movements are intact.  Neck: Neck is supple, no palpable cervical or supraclavicular lymphadenopathy. Heart: Regular in rate and rhythm with no murmurs, rubs, or gallops. Chest: Clear to auscultation bilaterally, with no rhonchi, wheezes, or rales. Abdomen: Soft, nontender, nondistended, with no rigidity or guarding. Laparoscopy scars healing well. Some bruising on the left flank Extremities: No cyanosis or edema. Lymphatics: see Neck Exam Skin: No concerning lesions. Musculoskeletal: symmetric strength and muscle tone throughout. Neurologic: Cranial nerves II through XII are grossly intact. No obvious focalities. Speech is fluent. Coordination is intact. Psychiatric: Judgment and insight are intact. Affect is appropriate. Pelvic exam deferred in light of recent surgery.   ECOG = 1  0 - Asymptomatic (Fully active, able to carry on all predisease activities without restriction)  1 - Symptomatic but completely ambulatory (Restricted in physically strenuous activity but  ambulatory and able to carry out work of a light or sedentary nature. For example, light housework, office work)  2 - Symptomatic, <50% in bed during the day (Ambulatory and capable of all self care but unable to carry out any work activities. Up and about more than 50% of waking hours)  3 - Symptomatic, >50% in bed, but not bedbound (Capable of only limited self-care, confined to bed or chair 50% or more of waking hours)  4 - Bedbound (Completely disabled. Cannot carry on any self-care. Totally confined to bed or chair)  5 - Death   Eustace Pen MM, Creech RH, Tormey DC, et al. [phone]). "Toxicity and response criteria of the [facility]". [place] Oncol. 5 (6): 649-55  LABORATORY DATA:  Lab Results  Component Value Date   WBC 6.0 09/03/2020   HGB 12.8 09/03/2020   HCT 38.7 09/03/2020   MCV 89.4 09/03/2020   PLT 283 09/03/2020   Lab Results  Component Value Date   NA 139 09/03/2020   K 4.1 09/03/2020   CL 105 09/03/2020   CO2 27 09/03/2020   GLUCOSE 94 09/03/2020   CREATININE 0.55 09/03/2020   CALCIUM 9.0 09/03/2020      RADIOGRAPHY: No results found.    IMPRESSION: Stage IB, grade 2, endometrioid endometrial cancer with high risk features (deep myometrial invasion, LVSI, and isolated tumor cells)  Given the pathologic findings of a deeply invasive tumor with LVSI and  isolated tumor cells within the sentinel lymph nodes I would agree with Dr. Serita Grit recommendations for pelvic radiation therapy followed by vaginal brachytherapy,  given these high risk features,  Today, I talked to the patient and husband about the findings and work-up thus far.  We discussed the natural history of endometrial cancer and general treatment, highlighting the role of radiotherapy (external beam and vaginal brachytherapy) in the management.  We discussed the available radiation techniques, and focused on the details of logistics and delivery.  We reviewed the anticipated acute and  late sequelae associated with radiation in this setting.  The patient was encouraged to ask questions that I answered to the best of my ability.  A patient consent form was discussed and signed.  We retained a copy for our records.  The patient would like to proceed with radiation and will be scheduled for CT simulation.  PLAN: The patient will return for CT simulation and planning on December 27 with treatments to begin the first week in January which will be approximately 6 weeks out from her surgery. She is to receive 5 weeks of external beam radiation therapy directed at the pelvis area. I am recommending intensity modulated radiation therapy to limit dose to the small bowel. After she has completed external beam radiation therapy the patient will then proceed with 3 intracavitary brachytherapy treatments directed at the vaginal cuff.  Total time spent in this encounter was 60 minutes which included reviewing the patient's most recent pelvic ultrasound, biopsy, pap smear, hysterectomy, pathology report, consultations, follow-ups, physical examination, and documentation.  ------------------------------------------------  [name], PhD, MD  This document serves as a record of services personally performed by [name], MD. It was created on his behalf by Clerance Lav, a trained medical scribe. The creation of this record is based on the scribe's personal observations and the provider's statements to them. This document has been checked and approved by the attending provider.

## 2020-10-01 ENCOUNTER — Other Ambulatory Visit: Payer: Self-pay | Admitting: Oncology

## 2020-10-01 ENCOUNTER — Encounter: Payer: Self-pay | Admitting: Oncology

## 2020-10-01 DIAGNOSIS — C541 Malignant neoplasm of endometrium: Secondary | ICD-10-CM

## 2020-10-01 NOTE — Progress Notes (Signed)
Manson Allan with CT scan appointment for 10/15/20 at 3:30 at [facility] (NPO 4 hours prior, drink contrast at 1:30 and 2:30).  She verbalized understanding of instructions and will pick up the contrast at her appointment with Dr. Denman George this Friday.

## 2020-10-01 NOTE — Progress Notes (Signed)
Gynecologic Oncology Multi-Disciplinary Disposition Conference Note  Date of the Conference: 09/30/2020  Patient Name: Paula Newman  Referring Provider: Dr. Ulanda Edison Primary GYN Oncologist: Dr. Denman George  Stage/Disposition:  Stage IB, grade 2 endometrioid endometrial cancer. Disposition is for a CT scan followed by external beam radiation and vaginal brachytherapy.   This Multidisciplinary conference took place involving physicians from [place], [place], Radiation Oncology, Pathology, Radiology along with the Gynecologic Oncology Nurse Practitioner and RN.  Comprehensive assessment of the patient's malignancy, staging, need for surgery, chemotherapy, radiation therapy, and need for further testing were reviewed. Supportive measures, both inpatient and following discharge were also discussed. The recommended plan of care is documented. Greater than 35 minutes were spent correlating and coordinating this patient's care.

## 2020-10-03 ENCOUNTER — Encounter: Payer: Self-pay | Admitting: Gynecologic Oncology

## 2020-10-04 ENCOUNTER — Encounter: Payer: Self-pay | Admitting: Gynecologic Oncology

## 2020-10-04 ENCOUNTER — Inpatient Hospital Stay (HOSPITAL_BASED_OUTPATIENT_CLINIC_OR_DEPARTMENT_OTHER): Payer: Medicare Other | Admitting: Gynecologic Oncology

## 2020-10-04 ENCOUNTER — Other Ambulatory Visit: Payer: Self-pay

## 2020-10-04 VITALS — BP 113/66 | HR 76 | Temp 99.3°F | Resp 16 | Ht 63.0 in | Wt 125.8 lb

## 2020-10-04 DIAGNOSIS — Z90710 Acquired absence of both cervix and uterus: Secondary | ICD-10-CM

## 2020-10-04 DIAGNOSIS — Z90722 Acquired absence of ovaries, bilateral: Secondary | ICD-10-CM | POA: Diagnosis not present

## 2020-10-04 DIAGNOSIS — C541 Malignant neoplasm of endometrium: Secondary | ICD-10-CM

## 2020-10-04 DIAGNOSIS — Z7189 Other specified counseling: Secondary | ICD-10-CM

## 2020-10-04 NOTE — Patient Instructions (Signed)
Dr Denman George is recommending radiation to the pelvis and vagina for your stage IB cancer which has risk factors for recurrence. It is safe to start this 6 weeks after surgery.  Dr Denman George will see you with Dr Sondra Come (alternating) every 3 months for 2 years.

## 2020-10-04 NOTE — Progress Notes (Addendum)
Gynecologic Oncology Follow-up Note   Chief Complaint:  Chief Complaint  Patient presents with  . Endometrial cancer [facility])    Assessment/Plan:  [name]  is a 69 y.o.  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 MMR normal.  High/intermediate risk factors for recurrence. Recommendation is for external beam radiation and vaginal brachytherapy to reduce risk for local recurrence in accordance with NCCN guidelines.  CT scan chest/abd/pelvis for staging purposes.   I discussed this with the patient. I discussed the role of adjuvant therapy. I discussed prognosis and risk for recurrence. We reviewed symptoms concerning for recurrence and she will see me if these develop prior to her scheduled appointment.  After completing adjuvant therapy I recommend she follow-up at 3 monthly intervals for symptom review, physical examination and pelvic examination. Pap smear is not recommended in routine endometrial cancer surveillance. After 2 years we will space these visits to every 6 months, and then annually if recurrence has not developed within 5 years. All questions were answered.  HPI: [name] is a 69 year old P 1 who was seen in consultation at the request of Dr Ulanda Edison for evaluation and treatment of grade 1 endometrial cancer.   Her symptoms began in October 2021 with vaginal bleeding which was postmenopausal.  She saw her primary care physician for a symptom related visit who referred her to see Dr. Ulanda Edison (she did not have a gynecologist that she received GYN care from her PCP) on August 07, 2020.  Work-up of symptoms included a transvaginal ultrasound and endometrial Pipelle and Pap smear. Transvaginal US on July 30, 2020 showed a uterus measuring 7.7 x 3.9 x 5.3 cm with an endometrial thickness of 23 mm. Endometrial sampling with an endometrial Pipelle  was performed on August 07, 2020 and showed FIGO grade 1-2 endometrioid endometrial adenocarcinoma. Pap testing was normal on 08/07/2020.  Interval Hx:  On 09/10/20 she underwent robotic assisted total hysterectomy, BSO, SLN biopsy. Intraoperative findings were significant for a 6cm normal appearing uterus with normal tubes and ovaries and no gross extrauterine disease. Surgery was uncomplicated.  Final pathology revealed a FIGO grade 2 endometrioid endometrial adenocarcinoma with deep myometrial invasion (45mm of 65mm thickness) with LVSI present. The adnexa and cervix were free of tumor. However, there were isolated tumor cells in both SLN's. MMR testing was in tact with MSS phenotype. This was staged as a stage IB grade 2 endometrial cancer with high/intermediate risk factors for recurrence and adjuvant radiation was recommended in accordance with NCCN guidelines.  She met with Dr Sondra Come from Radiation Oncology and plans were made for external beam and vaginal brachytherapy as adjuvant radiation.   Current Meds:  Outpatient Encounter Medications as of 10/04/2020  Medication Sig  . ezetimibe (ZETIA) 10 MG tablet Take 5 mg by mouth daily.  . polyethylene glycol (MIRALAX / GLYCOLAX) 17 g packet Take 17 g by mouth daily.  . pseudoephedrine (SUDAFED) 30 MG tablet Take 30 mg by mouth 2 (two) times daily as needed for congestion.  . simvastatin (ZOCOR) 20 MG tablet Take 10 mg by mouth daily at 6 PM.  . sodium chloride (OCEAN) 0.65 % SOLN nasal spray Place 1 spray into both nostrils every 6 (six) hours as needed for congestion. (Patient not taking: Reported on 10/03/2020)   No facility-administered encounter medications on file as of 10/04/2020.    Allergy:  Allergies  Allergen Reactions  . Coconut Oil Nausea And Vomiting  . Codeine Nausea And Vomiting    Social Hx:   Social History   Socioeconomic History  . Marital status: Married    Spouse name: Not on file  . Number of children: 1   . Years of education: Not on file  . Highest education level: Not on file  Occupational History  . Not on file  Tobacco Use  . Smoking status: Never Smoker  . Smokeless tobacco: Never Used  Vaping Use  . Vaping Use: Never used  Substance and Sexual Activity  . Alcohol use: Not Currently    Comment: rare occasion   2 glasses a year  . Drug use: Never  . Sexual activity: Not Currently    Birth control/protection: None  Other Topics Concern  . Not on file  Social History Narrative  . Not on file   Social Determinants of Health   Financial Resource Strain: Not on file  Food Insecurity: Not on file  Transportation Needs: Not on file  Physical Activity: Not on file  Stress: Not on file  Social Connections: Not on file  Intimate Partner Violence: Not on file    Past Surgical Hx:  Past Surgical History:  Procedure Laterality Date  . AUGMENTATION MAMMAPLASTY    . BREAST EXCISIONAL BIOPSY Bilateral   . MENISCUS REPAIR Bilateral   . ROBOTIC ASSISTED TOTAL HYSTERECTOMY WITH BILATERAL SALPINGO OOPHERECTOMY Bilateral 09/10/2020   Procedure: XI ROBOTIC ASSISTED TOTAL HYSTERECTOMY WITH BILATERAL SALPINGO OOPHORECTOMY;  Surgeon: Everitt Amber, MD;  Location: [facility];  Service: Gynecology;  Laterality: Bilateral;  . SENTINEL NODE BIOPSY N/A 09/10/2020   Procedure: SENTINEL NODE BIOPSY;  Surgeon: Everitt Amber, MD;  Location: [facility];  Service: Gynecology;  Laterality: N/A;    Past Medical Hx:  Past Medical History:  Diagnosis Date  . Cancer ([place])    endometrial  . Complication of anesthesia    slow to wake up  . Hyperlipidemia     Past Gynecological History:  See HPI, svd x 1, no hx of abnormal paps No LMP recorded. Patient is postmenopausal.  Family Hx:  Family History  Problem Relation Age of Onset  . Hypertension Mother   . Hypertension Father   . Breast cancer Sister 29  . Breast cancer Paternal Grandmother     Review of Systems:  Constitutional  Feels well,     ENT Normal appearing ears and nares bilaterally Skin/Breast  No rash, sores, jaundice, itching, dryness Cardiovascular  No chest pain, shortness of breath, or edema  Pulmonary  No cough or wheeze.  Gastro Intestinal  No complaints  Genito Urinary  No frequency, urgency, dysuria, no bleeding Musculo Skeletal  No myalgia, arthralgia, joint swelling or pain  Neurologic  No weakness, numbness, change in gait,  Psychology  No depression, anxiety, insomnia.   Vitals:  Blood pressure 113/66, pulse 76, temperature 99.3 F (37.4 C), temperature source Tympanic, resp. rate 16, height $RemoveBe'5\' 3"'MVgaCJDBG$  (1.6 m), weight 125 lb 12.8 oz (57.1 kg), SpO2 99 %.  Physical Exam: WD in NAD Neck  Supple NROM, without any enlargements.  Lymph Node Survey No cervical supraclavicular or inguinal adenopathy Cardiovascular  Well perfused peripheries.   Lungs  No increased WOB Skin  No rash/lesions/breakdown  Psychiatry  Alert and oriented to person, place, and time  Abdomen  Normoactive bowel sounds, abdomen soft, non-tender and nonobese without evidence of hernia. Well healed incisions. Back No CVA tenderness Genito Urinary  Vulva/vagina: well  healed vaginal cuff, some vaginal echymosis, no palpable masses Rectal  deferred  Extremities  No bilateral cyanosis, clubbing or edema.  30 minutes of direct face to face counseling time was spent with the patient. This included discussion about prognosis, therapy recommendations and postoperative side effects and are beyond the scope of routine postoperative care.  Thereasa Solo, MD  10/04/2020, 5:50 PM

## 2020-10-10 ENCOUNTER — Telehealth: Payer: Self-pay | Admitting: *Deleted

## 2020-10-10 NOTE — Telephone Encounter (Signed)
CALLED PATIENT TO ALTER SIM APPT. DUE TO DR. KINARD BEING IN THE OR ON 10-14-20, SIM  APPT. MOVED TO 10-15-20 -ARRIVAL TIME- 10:45 AM, SPOKE WITH PATIENT AND SHE AGREED TO THIS NEW DATE AND TIME

## 2020-10-11 DIAGNOSIS — N390 Urinary tract infection, site not specified: Secondary | ICD-10-CM | POA: Diagnosis not present

## 2020-10-14 ENCOUNTER — Ambulatory Visit: Payer: Medicare Other | Admitting: Radiation Oncology

## 2020-10-15 ENCOUNTER — Ambulatory Visit
Admission: RE | Admit: 2020-10-15 | Discharge: 2020-10-15 | Disposition: A | Discharge status: Home / Self Care | Payer: Medicare Other | Source: Ambulatory Visit | Attending: Radiation Oncology | Admitting: Radiation Oncology

## 2020-10-15 ENCOUNTER — Ambulatory Visit (HOSPITAL_COMMUNITY)
Admission: RE | Admit: 2020-10-15 | Discharge: 2020-10-15 | Disposition: A | Discharge status: Home / Self Care | Payer: Medicare Other | Source: Ambulatory Visit | Attending: Gynecologic Oncology | Admitting: Gynecologic Oncology

## 2020-10-15 ENCOUNTER — Other Ambulatory Visit: Payer: Self-pay

## 2020-10-15 DIAGNOSIS — N950 Postmenopausal bleeding: Secondary | ICD-10-CM | POA: Diagnosis not present

## 2020-10-15 DIAGNOSIS — C541 Malignant neoplasm of endometrium: Secondary | ICD-10-CM | POA: Insufficient documentation

## 2020-10-15 DIAGNOSIS — Z510 Encounter for antineoplastic radiation therapy: Secondary | ICD-10-CM | POA: Insufficient documentation

## 2020-10-15 DIAGNOSIS — M419 Scoliosis, unspecified: Secondary | ICD-10-CM | POA: Diagnosis not present

## 2020-10-15 DIAGNOSIS — Z90710 Acquired absence of both cervix and uterus: Secondary | ICD-10-CM | POA: Diagnosis not present

## 2020-10-15 LAB — POCT I-STAT CREATININE: Creatinine, Ser: 0.7 mg/dL (ref 0.44–1.00)

## 2020-10-15 MED ORDER — IOHEXOL 300 MG/ML  SOLN
100.0000 mL | Freq: Once | INTRAMUSCULAR | Status: AC | PRN
  Administered 2020-10-15: 100 mL via INTRAVENOUS

## 2020-10-21 ENCOUNTER — Telehealth: Payer: Self-pay | Admitting: Oncology

## 2020-10-21 DIAGNOSIS — R928 Other abnormal and inconclusive findings on diagnostic imaging of breast: Secondary | ICD-10-CM

## 2020-10-21 DIAGNOSIS — N63 Unspecified lump in unspecified breast: Secondary | ICD-10-CM

## 2020-10-21 DIAGNOSIS — C541 Malignant neoplasm of endometrium: Secondary | ICD-10-CM

## 2020-10-21 NOTE — Telephone Encounter (Signed)
Paula Newman called regarding her CT results.  She is wondering what she needs to do for the nodularity in her left breast.  She is not sure who she needs to ask and if she would need another mammogram.

## 2020-10-22 DIAGNOSIS — C541 Malignant neoplasm of endometrium: Secondary | ICD-10-CM | POA: Insufficient documentation

## 2020-10-22 DIAGNOSIS — Z510 Encounter for antineoplastic radiation therapy: Secondary | ICD-10-CM | POA: Insufficient documentation

## 2020-10-22 NOTE — Telephone Encounter (Signed)
[facility] and scheduled a diagnostic unilateral mammogram and ultrasound of the left breast tomorrow at 8:20.  Called Paula Newman and advised her that Warner Mccreedy, NP is recommending that she have a diagnostic mammogram and ultrasound of the left breast due to the CT findings of nodularity around her left breast implant.  Notified her of appointment for tomorrow at the [facility] with an 8 am arrival.  She verbalized understanding and agreement.

## 2020-10-23 ENCOUNTER — Other Ambulatory Visit: Payer: Self-pay | Admitting: Gynecologic Oncology

## 2020-10-23 ENCOUNTER — Ambulatory Visit
Admission: RE | Admit: 2020-10-23 | Discharge: 2020-10-23 | Disposition: A | Discharge status: Home / Self Care | Payer: Medicare Other | Source: Ambulatory Visit | Attending: Gynecologic Oncology | Admitting: Gynecologic Oncology

## 2020-10-23 ENCOUNTER — Other Ambulatory Visit: Payer: Self-pay

## 2020-10-23 DIAGNOSIS — R928 Other abnormal and inconclusive findings on diagnostic imaging of breast: Secondary | ICD-10-CM

## 2020-10-23 DIAGNOSIS — N6489 Other specified disorders of breast: Secondary | ICD-10-CM | POA: Diagnosis not present

## 2020-10-23 DIAGNOSIS — N63 Unspecified lump in unspecified breast: Secondary | ICD-10-CM

## 2020-10-24 ENCOUNTER — Other Ambulatory Visit: Payer: Self-pay | Admitting: *Deleted

## 2020-10-24 ENCOUNTER — Telehealth: Payer: Self-pay | Admitting: Oncology

## 2020-10-24 ENCOUNTER — Other Ambulatory Visit: Payer: Self-pay

## 2020-10-24 ENCOUNTER — Ambulatory Visit
Admission: RE | Admit: 2020-10-24 | Discharge: 2020-10-24 | Disposition: A | Discharge status: Home / Self Care | Payer: Medicare Other | Source: Ambulatory Visit | Attending: Radiation Oncology | Admitting: Radiation Oncology

## 2020-10-24 DIAGNOSIS — Z9882 Breast implant status: Secondary | ICD-10-CM

## 2020-10-24 DIAGNOSIS — Z510 Encounter for antineoplastic radiation therapy: Secondary | ICD-10-CM | POA: Diagnosis not present

## 2020-10-24 DIAGNOSIS — Z8542 Personal history of malignant neoplasm of other parts of uterus: Secondary | ICD-10-CM

## 2020-10-24 DIAGNOSIS — C541 Malignant neoplasm of endometrium: Secondary | ICD-10-CM | POA: Diagnosis not present

## 2020-10-24 NOTE — Telephone Encounter (Signed)
Called Paula Newman and let her know that Dr. Andrey Farmer has ordered an MRI and the schedulers will call her with an appointment.

## 2020-10-24 NOTE — Telephone Encounter (Signed)
Paula Newman called and asked about plans for an MRI.  Advised her we are going to have Dr. Andrey Farmer review her mammogram and ultrasounds results and will get back to her with recommendations.

## 2020-10-25 ENCOUNTER — Ambulatory Visit
Admission: RE | Admit: 2020-10-25 | Discharge: 2020-10-25 | Disposition: A | Discharge status: Home / Self Care | Payer: Medicare Other | Source: Ambulatory Visit | Attending: Radiation Oncology | Admitting: Radiation Oncology

## 2020-10-25 ENCOUNTER — Other Ambulatory Visit: Payer: Self-pay

## 2020-10-25 DIAGNOSIS — Z510 Encounter for antineoplastic radiation therapy: Secondary | ICD-10-CM | POA: Diagnosis not present

## 2020-10-25 DIAGNOSIS — C541 Malignant neoplasm of endometrium: Secondary | ICD-10-CM | POA: Diagnosis not present

## 2020-10-28 ENCOUNTER — Other Ambulatory Visit: Payer: Self-pay

## 2020-10-28 ENCOUNTER — Ambulatory Visit
Admission: RE | Admit: 2020-10-28 | Discharge: 2020-10-28 | Disposition: A | Discharge status: Home / Self Care | Payer: Medicare Other | Source: Ambulatory Visit | Attending: Radiation Oncology | Admitting: Radiation Oncology

## 2020-10-28 DIAGNOSIS — Z510 Encounter for antineoplastic radiation therapy: Secondary | ICD-10-CM | POA: Diagnosis not present

## 2020-10-28 DIAGNOSIS — C541 Malignant neoplasm of endometrium: Secondary | ICD-10-CM | POA: Diagnosis not present

## 2020-10-29 ENCOUNTER — Ambulatory Visit
Admission: RE | Admit: 2020-10-29 | Discharge: 2020-10-29 | Disposition: A | Discharge status: Home / Self Care | Payer: Medicare Other | Source: Ambulatory Visit | Attending: Radiation Oncology | Admitting: Radiation Oncology

## 2020-10-29 DIAGNOSIS — Z510 Encounter for antineoplastic radiation therapy: Secondary | ICD-10-CM | POA: Diagnosis not present

## 2020-10-29 DIAGNOSIS — C541 Malignant neoplasm of endometrium: Secondary | ICD-10-CM | POA: Diagnosis not present

## 2020-10-30 ENCOUNTER — Ambulatory Visit
Admission: RE | Admit: 2020-10-30 | Discharge: 2020-10-30 | Disposition: A | Discharge status: Home / Self Care | Payer: Medicare Other | Source: Ambulatory Visit | Attending: Radiation Oncology | Admitting: Radiation Oncology

## 2020-10-30 ENCOUNTER — Other Ambulatory Visit: Payer: Self-pay

## 2020-10-30 DIAGNOSIS — Z510 Encounter for antineoplastic radiation therapy: Secondary | ICD-10-CM | POA: Diagnosis not present

## 2020-10-30 DIAGNOSIS — C541 Malignant neoplasm of endometrium: Secondary | ICD-10-CM | POA: Diagnosis not present

## 2020-10-31 ENCOUNTER — Ambulatory Visit
Admission: RE | Admit: 2020-10-31 | Discharge: 2020-10-31 | Disposition: A | Discharge status: Home / Self Care | Payer: Medicare Other | Source: Ambulatory Visit | Attending: Radiation Oncology | Admitting: Radiation Oncology

## 2020-10-31 ENCOUNTER — Other Ambulatory Visit: Payer: Self-pay

## 2020-10-31 DIAGNOSIS — Z510 Encounter for antineoplastic radiation therapy: Secondary | ICD-10-CM | POA: Diagnosis not present

## 2020-10-31 DIAGNOSIS — C541 Malignant neoplasm of endometrium: Secondary | ICD-10-CM | POA: Diagnosis not present

## 2020-11-01 ENCOUNTER — Ambulatory Visit
Admission: RE | Admit: 2020-11-01 | Discharge: 2020-11-01 | Disposition: A | Discharge status: Home / Self Care | Payer: Medicare Other | Source: Ambulatory Visit | Attending: Radiation Oncology | Admitting: Radiation Oncology

## 2020-11-01 DIAGNOSIS — Z510 Encounter for antineoplastic radiation therapy: Secondary | ICD-10-CM | POA: Diagnosis not present

## 2020-11-01 DIAGNOSIS — C541 Malignant neoplasm of endometrium: Secondary | ICD-10-CM | POA: Diagnosis not present

## 2020-11-04 ENCOUNTER — Other Ambulatory Visit: Payer: Self-pay

## 2020-11-04 ENCOUNTER — Ambulatory Visit
Admission: RE | Admit: 2020-11-04 | Discharge: 2020-11-04 | Disposition: A | Discharge status: Home / Self Care | Payer: Medicare Other | Source: Ambulatory Visit | Attending: Radiation Oncology | Admitting: Radiation Oncology

## 2020-11-04 DIAGNOSIS — C541 Malignant neoplasm of endometrium: Secondary | ICD-10-CM | POA: Diagnosis not present

## 2020-11-04 DIAGNOSIS — Z510 Encounter for antineoplastic radiation therapy: Secondary | ICD-10-CM | POA: Diagnosis not present

## 2020-11-05 ENCOUNTER — Ambulatory Visit
Admission: RE | Admit: 2020-11-05 | Discharge: 2020-11-05 | Disposition: A | Discharge status: Home / Self Care | Payer: Medicare Other | Source: Ambulatory Visit | Attending: Radiation Oncology | Admitting: Radiation Oncology

## 2020-11-05 ENCOUNTER — Other Ambulatory Visit: Payer: Self-pay

## 2020-11-05 DIAGNOSIS — Z510 Encounter for antineoplastic radiation therapy: Secondary | ICD-10-CM | POA: Diagnosis not present

## 2020-11-05 DIAGNOSIS — C541 Malignant neoplasm of endometrium: Secondary | ICD-10-CM | POA: Diagnosis not present

## 2020-11-06 ENCOUNTER — Ambulatory Visit
Admission: RE | Admit: 2020-11-06 | Discharge: 2020-11-06 | Disposition: A | Discharge status: Home / Self Care | Payer: Medicare Other | Source: Ambulatory Visit | Attending: Radiation Oncology | Admitting: Radiation Oncology

## 2020-11-06 DIAGNOSIS — Z510 Encounter for antineoplastic radiation therapy: Secondary | ICD-10-CM | POA: Diagnosis not present

## 2020-11-06 DIAGNOSIS — C541 Malignant neoplasm of endometrium: Secondary | ICD-10-CM | POA: Diagnosis not present

## 2020-11-07 ENCOUNTER — Ambulatory Visit
Admission: RE | Admit: 2020-11-07 | Discharge: 2020-11-07 | Disposition: A | Discharge status: Home / Self Care | Payer: Medicare Other | Source: Ambulatory Visit | Attending: Radiation Oncology | Admitting: Radiation Oncology

## 2020-11-07 ENCOUNTER — Other Ambulatory Visit: Payer: Self-pay

## 2020-11-07 ENCOUNTER — Ambulatory Visit (HOSPITAL_COMMUNITY)
Admission: RE | Admit: 2020-11-07 | Discharge: 2020-11-07 | Disposition: A | Discharge status: Home / Self Care | Payer: Medicare Other | Source: Ambulatory Visit | Attending: Gynecologic Oncology | Admitting: Gynecologic Oncology

## 2020-11-07 ENCOUNTER — Telehealth: Payer: Self-pay | Admitting: Oncology

## 2020-11-07 DIAGNOSIS — Z8542 Personal history of malignant neoplasm of other parts of uterus: Secondary | ICD-10-CM | POA: Diagnosis not present

## 2020-11-07 DIAGNOSIS — Z510 Encounter for antineoplastic radiation therapy: Secondary | ICD-10-CM | POA: Diagnosis not present

## 2020-11-07 DIAGNOSIS — Z9882 Breast implant status: Secondary | ICD-10-CM | POA: Insufficient documentation

## 2020-11-07 DIAGNOSIS — N6489 Other specified disorders of breast: Secondary | ICD-10-CM | POA: Diagnosis not present

## 2020-11-07 DIAGNOSIS — C541 Malignant neoplasm of endometrium: Secondary | ICD-10-CM | POA: Diagnosis not present

## 2020-11-07 MED ORDER — GADOBUTROL 1 MMOL/ML IV SOLN
6.0000 mL | Freq: Once | INTRAVENOUS | Status: AC | PRN
  Administered 2020-11-07: 6 mL via INTRAVENOUS

## 2020-11-07 NOTE — Telephone Encounter (Signed)
Called Stefhanie and advised her of MRI results.  Discussed that we will reach out to the breast nurse navigators to see who they recommend for implant ruptures.  Soren said she has a doctor that she would like to see and will call back with their name.

## 2020-11-08 ENCOUNTER — Telehealth: Payer: Self-pay | Admitting: Oncology

## 2020-11-08 ENCOUNTER — Other Ambulatory Visit: Payer: Self-pay

## 2020-11-08 ENCOUNTER — Ambulatory Visit
Admission: RE | Admit: 2020-11-08 | Discharge: 2020-11-08 | Disposition: A | Discharge status: Home / Self Care | Payer: Medicare Other | Source: Ambulatory Visit | Attending: Radiation Oncology | Admitting: Radiation Oncology

## 2020-11-08 DIAGNOSIS — Z9882 Breast implant status: Secondary | ICD-10-CM

## 2020-11-08 DIAGNOSIS — C541 Malignant neoplasm of endometrium: Secondary | ICD-10-CM | POA: Diagnosis not present

## 2020-11-08 DIAGNOSIS — Z510 Encounter for antineoplastic radiation therapy: Secondary | ICD-10-CM | POA: Diagnosis not present

## 2020-11-08 NOTE — Telephone Encounter (Signed)
Called in referral to Dr. Ree Kida at [phone].  His office said he has retired.    Call [place] and let her know and she is going to do some more research and call back with who she would like to see.

## 2020-11-11 ENCOUNTER — Ambulatory Visit
Admission: RE | Admit: 2020-11-11 | Discharge: 2020-11-11 | Disposition: A | Discharge status: Home / Self Care | Payer: Medicare Other | Source: Ambulatory Visit | Attending: Radiation Oncology | Admitting: Radiation Oncology

## 2020-11-11 DIAGNOSIS — Z510 Encounter for antineoplastic radiation therapy: Secondary | ICD-10-CM | POA: Diagnosis not present

## 2020-11-11 DIAGNOSIS — C541 Malignant neoplasm of endometrium: Secondary | ICD-10-CM | POA: Diagnosis not present

## 2020-11-11 NOTE — Telephone Encounter (Addendum)
Called and faxed referral to [facility] and to Dr. Iran Planas at [facility] per patient request.

## 2020-11-12 ENCOUNTER — Ambulatory Visit
Admission: RE | Admit: 2020-11-12 | Discharge: 2020-11-12 | Disposition: A | Discharge status: Home / Self Care | Payer: Medicare Other | Source: Ambulatory Visit | Attending: Radiation Oncology | Admitting: Radiation Oncology

## 2020-11-12 ENCOUNTER — Other Ambulatory Visit: Payer: Self-pay

## 2020-11-12 DIAGNOSIS — Z510 Encounter for antineoplastic radiation therapy: Secondary | ICD-10-CM | POA: Diagnosis not present

## 2020-11-12 DIAGNOSIS — C541 Malignant neoplasm of endometrium: Secondary | ICD-10-CM | POA: Diagnosis not present

## 2020-11-13 ENCOUNTER — Telehealth: Payer: Self-pay | Admitting: Oncology

## 2020-11-13 ENCOUNTER — Other Ambulatory Visit: Payer: Self-pay

## 2020-11-13 ENCOUNTER — Ambulatory Visit
Admission: RE | Admit: 2020-11-13 | Discharge: 2020-11-13 | Disposition: A | Discharge status: Home / Self Care | Payer: Medicare Other | Source: Ambulatory Visit | Attending: Radiation Oncology | Admitting: Radiation Oncology

## 2020-11-13 DIAGNOSIS — Z510 Encounter for antineoplastic radiation therapy: Secondary | ICD-10-CM | POA: Diagnosis not present

## 2020-11-13 DIAGNOSIS — C541 Malignant neoplasm of endometrium: Secondary | ICD-10-CM | POA: Diagnosis not present

## 2020-11-13 NOTE — Telephone Encounter (Signed)
Tobin called and asked if her brachytherapy appointments are going to be scheduled on Tuesdays after she finishes her external beam treatments.  Advised her that I will check with Dr. Sondra Come and we will call her back with appointment.

## 2020-11-14 ENCOUNTER — Other Ambulatory Visit: Payer: Self-pay

## 2020-11-14 ENCOUNTER — Ambulatory Visit
Admission: RE | Admit: 2020-11-14 | Discharge: 2020-11-14 | Disposition: A | Discharge status: Home / Self Care | Payer: Medicare Other | Source: Ambulatory Visit | Attending: Radiation Oncology | Admitting: Radiation Oncology

## 2020-11-14 DIAGNOSIS — Z510 Encounter for antineoplastic radiation therapy: Secondary | ICD-10-CM | POA: Diagnosis not present

## 2020-11-14 DIAGNOSIS — C541 Malignant neoplasm of endometrium: Secondary | ICD-10-CM | POA: Diagnosis not present

## 2020-11-15 ENCOUNTER — Ambulatory Visit
Admission: RE | Admit: 2020-11-15 | Discharge: 2020-11-15 | Disposition: A | Discharge status: Home / Self Care | Payer: Medicare Other | Source: Ambulatory Visit | Attending: Radiation Oncology | Admitting: Radiation Oncology

## 2020-11-15 ENCOUNTER — Other Ambulatory Visit: Payer: Self-pay

## 2020-11-15 ENCOUNTER — Telehealth: Payer: Self-pay | Admitting: *Deleted

## 2020-11-15 DIAGNOSIS — Z510 Encounter for antineoplastic radiation therapy: Secondary | ICD-10-CM | POA: Diagnosis not present

## 2020-11-15 DIAGNOSIS — C541 Malignant neoplasm of endometrium: Secondary | ICD-10-CM | POA: Diagnosis not present

## 2020-11-15 NOTE — Telephone Encounter (Signed)
Returned patient's phone call, spoke with patient 

## 2020-11-18 ENCOUNTER — Ambulatory Visit
Admission: RE | Admit: 2020-11-18 | Discharge: 2020-11-18 | Disposition: A | Discharge status: Home / Self Care | Payer: Medicare Other | Source: Ambulatory Visit | Attending: Radiation Oncology | Admitting: Radiation Oncology

## 2020-11-18 ENCOUNTER — Other Ambulatory Visit: Payer: Self-pay

## 2020-11-18 DIAGNOSIS — C541 Malignant neoplasm of endometrium: Secondary | ICD-10-CM | POA: Diagnosis not present

## 2020-11-18 DIAGNOSIS — Z510 Encounter for antineoplastic radiation therapy: Secondary | ICD-10-CM | POA: Diagnosis not present

## 2020-11-19 ENCOUNTER — Ambulatory Visit
Admission: RE | Admit: 2020-11-19 | Discharge: 2020-11-19 | Disposition: A | Discharge status: Home / Self Care | Payer: Medicare Other | Source: Ambulatory Visit | Attending: Radiation Oncology | Admitting: Radiation Oncology

## 2020-11-19 ENCOUNTER — Telehealth: Payer: Self-pay | Admitting: *Deleted

## 2020-11-19 ENCOUNTER — Other Ambulatory Visit: Payer: Self-pay

## 2020-11-19 DIAGNOSIS — C541 Malignant neoplasm of endometrium: Secondary | ICD-10-CM | POA: Insufficient documentation

## 2020-11-19 DIAGNOSIS — Z510 Encounter for antineoplastic radiation therapy: Secondary | ICD-10-CM | POA: Diagnosis not present

## 2020-11-19 NOTE — Telephone Encounter (Signed)
CALLED PATIENT TO INFORM OF NEW HDR [place] - SPOKE WITH PATIENT AND SHE IS AWARE OF ALL DATES AND TIMES FOR THESE APPTS.

## 2020-11-20 ENCOUNTER — Other Ambulatory Visit: Payer: Self-pay

## 2020-11-20 ENCOUNTER — Ambulatory Visit
Admission: RE | Admit: 2020-11-20 | Discharge: 2020-11-20 | Disposition: A | Discharge status: Home / Self Care | Payer: Medicare Other | Source: Ambulatory Visit | Attending: Radiation Oncology | Admitting: Radiation Oncology

## 2020-11-20 DIAGNOSIS — C541 Malignant neoplasm of endometrium: Secondary | ICD-10-CM | POA: Diagnosis not present

## 2020-11-20 DIAGNOSIS — Z510 Encounter for antineoplastic radiation therapy: Secondary | ICD-10-CM | POA: Diagnosis not present

## 2020-11-21 ENCOUNTER — Ambulatory Visit
Admission: RE | Admit: 2020-11-21 | Discharge: 2020-11-21 | Disposition: A | Discharge status: Home / Self Care | Payer: Medicare Other | Source: Ambulatory Visit | Attending: Radiation Oncology | Admitting: Radiation Oncology

## 2020-11-21 ENCOUNTER — Other Ambulatory Visit: Payer: Self-pay

## 2020-11-21 DIAGNOSIS — Z510 Encounter for antineoplastic radiation therapy: Secondary | ICD-10-CM | POA: Diagnosis not present

## 2020-11-21 DIAGNOSIS — C541 Malignant neoplasm of endometrium: Secondary | ICD-10-CM | POA: Diagnosis not present

## 2020-11-21 NOTE — Telephone Encounter (Signed)
Kennidee called and has not heard about scheduling an appointment with plastic surgery.  Called [facility].  They have received the referral and will reach out to [facility] to schedule an appointment.  Called Dr. Lucas Mallow office and they have not received the referral.  Refaxed it successfully.  Called [place] back and advised her of above.  Encouraged her to call back if she has not heard from either office.

## 2020-11-22 ENCOUNTER — Ambulatory Visit
Admission: RE | Admit: 2020-11-22 | Discharge: 2020-11-22 | Disposition: A | Discharge status: Home / Self Care | Payer: Medicare Other | Source: Ambulatory Visit | Attending: Radiation Oncology | Admitting: Radiation Oncology

## 2020-11-22 ENCOUNTER — Other Ambulatory Visit: Payer: Self-pay

## 2020-11-22 DIAGNOSIS — C541 Malignant neoplasm of endometrium: Secondary | ICD-10-CM | POA: Diagnosis not present

## 2020-11-22 DIAGNOSIS — Z510 Encounter for antineoplastic radiation therapy: Secondary | ICD-10-CM | POA: Diagnosis not present

## 2020-11-25 ENCOUNTER — Ambulatory Visit
Admission: RE | Admit: 2020-11-25 | Discharge: 2020-11-25 | Disposition: A | Discharge status: Home / Self Care | Payer: Medicare Other | Source: Ambulatory Visit | Attending: Radiation Oncology | Admitting: Radiation Oncology

## 2020-11-25 ENCOUNTER — Other Ambulatory Visit: Payer: Self-pay

## 2020-11-25 DIAGNOSIS — Z9013 Acquired absence of bilateral breasts and nipples: Secondary | ICD-10-CM | POA: Insufficient documentation

## 2020-11-25 DIAGNOSIS — Z9889 Other specified postprocedural states: Secondary | ICD-10-CM | POA: Insufficient documentation

## 2020-11-25 DIAGNOSIS — C541 Malignant neoplasm of endometrium: Secondary | ICD-10-CM | POA: Diagnosis not present

## 2020-11-25 DIAGNOSIS — T8549XA Other mechanical complication of breast prosthesis and implant, initial encounter: Secondary | ICD-10-CM | POA: Diagnosis not present

## 2020-11-25 DIAGNOSIS — Z510 Encounter for antineoplastic radiation therapy: Secondary | ICD-10-CM | POA: Diagnosis not present

## 2020-11-26 ENCOUNTER — Ambulatory Visit
Admission: RE | Admit: 2020-11-26 | Discharge: 2020-11-26 | Disposition: A | Discharge status: Home / Self Care | Payer: Medicare Other | Source: Ambulatory Visit | Attending: Radiation Oncology | Admitting: Radiation Oncology

## 2020-11-26 ENCOUNTER — Other Ambulatory Visit: Payer: Self-pay

## 2020-11-26 DIAGNOSIS — C541 Malignant neoplasm of endometrium: Secondary | ICD-10-CM | POA: Diagnosis not present

## 2020-11-26 DIAGNOSIS — Z510 Encounter for antineoplastic radiation therapy: Secondary | ICD-10-CM | POA: Diagnosis not present

## 2020-11-27 ENCOUNTER — Ambulatory Visit
Admission: RE | Admit: 2020-11-27 | Discharge: 2020-11-27 | Disposition: A | Discharge status: Home / Self Care | Payer: Medicare Other | Source: Ambulatory Visit | Attending: Radiation Oncology | Admitting: Radiation Oncology

## 2020-11-27 ENCOUNTER — Other Ambulatory Visit: Payer: Self-pay

## 2020-11-27 DIAGNOSIS — Z510 Encounter for antineoplastic radiation therapy: Secondary | ICD-10-CM | POA: Diagnosis not present

## 2020-11-27 DIAGNOSIS — C541 Malignant neoplasm of endometrium: Secondary | ICD-10-CM | POA: Diagnosis not present

## 2020-12-02 ENCOUNTER — Telehealth: Payer: Self-pay | Admitting: *Deleted

## 2020-12-02 NOTE — Telephone Encounter (Signed)
CALLED PATIENT TO REMIND OF NEW [facility]DR [facility], SPOKE WITH PATIENT AND SHE IS AWARE OF THESE APPTS 

## 2020-12-03 ENCOUNTER — Ambulatory Visit
Admission: RE | Admit: 2020-12-03 | Discharge: 2020-12-03 | Disposition: A | Discharge status: Home / Self Care | Payer: Medicare Other | Source: Ambulatory Visit | Attending: Radiation Oncology | Admitting: Radiation Oncology

## 2020-12-03 ENCOUNTER — Encounter: Payer: Self-pay | Admitting: Radiation Oncology

## 2020-12-03 ENCOUNTER — Other Ambulatory Visit: Payer: Self-pay

## 2020-12-03 DIAGNOSIS — R197 Diarrhea, unspecified: Secondary | ICD-10-CM | POA: Insufficient documentation

## 2020-12-03 DIAGNOSIS — C541 Malignant neoplasm of endometrium: Secondary | ICD-10-CM

## 2020-12-03 DIAGNOSIS — Z510 Encounter for antineoplastic radiation therapy: Secondary | ICD-10-CM | POA: Diagnosis not present

## 2020-12-03 DIAGNOSIS — Z79899 Other long term (current) drug therapy: Secondary | ICD-10-CM | POA: Insufficient documentation

## 2020-12-03 NOTE — Progress Notes (Signed)
See md follow up for nursing note

## 2020-12-03 NOTE — Progress Notes (Signed)
  Radiation Oncology         [phone] ________________________________  Name: Paula Newman MRN: [id number]  Date: 12/03/2020  DOB: 02/03/1951  CC: [name], MD  [name], MD  HDR BRACHYTHERAPY NOTE  DIAGNOSIS: Stage IB, grade 2, endometrioid endometrial cancer with high risk features (deep myometrial invasion, LVSI, and isolated tumor cells)   Simple treatment device note: Patient had construction of her custom vaginal cylinder. She will be treated with a 2.0 cm diameter segmented cylinder. This conforms to her anatomy without undue discomfort.  Vaginal brachytherapy procedure node: The patient was brought to the [place] suite. Identity was confirmed. All relevant records and images related to the planned course of therapy were reviewed. The patient freely provided informed written consent to proceed with treatment after reviewing the details related to the planned course of therapy. The consent form was witnessed and verified by the simulation staff. Then, the patient was set-up in a stable reproducible supine position for radiation therapy. Pelvic exam revealed the vaginal cuff to be intact 6. The patient's custom vaginal cylinder was placed in the proximal vagina. This was affixed to the CT/MR stabilization plate to prevent slippage. Patient tolerated the placement well.  Verification simulation note:  A fiducial marker was placed within the vaginal cylinder. An AP and lateral film was then obtained through the pelvis area. This documented accurate position of the vaginal cylinder for treatment.  HDR BRACHYTHERAPY TREATMENT  The remote afterloading device was affixed to the vaginal cylinder by catheter. Patient then proceeded to undergo her first high-dose-rate treatment directed at the proximal vagina. The patient was prescribed a dose of 6.0 gray to be delivered to the mucosal surface. Treatment length was 2.5 cm. Patient was treated with 1 channel using 6 dwell positions.  Treatment time was 139.0 seconds. Iridium 192 was the high-dose-rate source for treatment. The patient tolerated the treatment well. After completion of her therapy, a radiation survey was performed documenting return of the iridium source into the GammaMed safe.   PLAN: The patient will return next week for her second high-dose-rate treatment. ________________________________    [name], PhD, MD  This document serves as a record of services personally performed by [name], MD. It was created on his behalf by Clerance Lav, a trained medical scribe. The creation of this record is based on the scribe's personal observations and the provider's statements to them. This document has been checked and approved by the attending provider.

## 2020-12-03 NOTE — Progress Notes (Signed)
Radiation Oncology         [phone] ________________________________  Name: Paula Newman MRN: [id number]  Date: 12/03/2020  DOB: 12-May-1951  Vaginal Brachytherapy Procedure Note  CC: [name], MD Everitt Amber, MD    ICD-10-CM   1. Endometrial cancer ([place])  C54.1     Diagnosis: Stage IB, grade 2, endometrioid endometrial cancer with high risk features (deep myometrial invasion, LVSI, and isolated tumor cells)  anticapated Radiation Treatment Dates: 12/03/2020, 12/10/20, 12/17/2020  Narrative: She returns today for vaginal cylinder fitting. She completed IMRT last week.  On review of systems, she reports improvement in her diarrhea.. She denies vaginal bleeding.  ALLERGIES: is allergic to coconut oil and codeine.  Meds: Current Outpatient Medications  Medication Sig Dispense Refill  . simvastatin (ZOCOR) 20 MG tablet Take 10 mg by mouth daily at 6 PM.    . ezetimibe (ZETIA) 10 MG tablet Take 5 mg by mouth daily. (Patient not taking: Reported on 12/03/2020)    . polyethylene glycol (MIRALAX / GLYCOLAX) 17 g packet Take 17 g by mouth daily. (Patient not taking: Reported on 12/03/2020)    . pseudoephedrine (SUDAFED) 30 MG tablet Take 30 mg by mouth 2 (two) times daily as needed for congestion. (Patient not taking: Reported on 12/03/2020)    . sodium chloride (OCEAN) 0.65 % SOLN nasal spray Place 1 spray into both nostrils every 6 (six) hours as needed for congestion. (Patient not taking: No sig reported)     No current facility-administered medications for this encounter.    Physical Findings: The patient is in no acute distress. Patient is alert and oriented.  height is 5\' 3"  (1.6 m) and weight is 118 lb (53.5 kg). Her temporal temperature is 97.1 F (36.2 C) (abnormal). Her blood pressure is 122/73 and her pulse is 67. Her respiration is 18 and oxygen saturation is 100%.   No palpable cervical, supraclavicular or axillary lymphoadenopathy. The heart has a regular rate  and rhythm. The lungs are clear to auscultation. Abdomen soft and non-tender.  On pelvic examination the external genitalia were unremarkable. A speculum exam was performed. Vaginal cuff intact, no mucosal lesions. On bimanual exam there were no pelvic masses appreciated. Erythema to vaginal mucosa c/w external beam radiation effect.  Lab Findings: Lab Results  Component Value Date   WBC 6.0 09/03/2020   HGB 12.8 09/03/2020   HCT 38.7 09/03/2020   MCV 89.4 09/03/2020   PLT 283 09/03/2020    Radiographic Findings: MR BREAST BILATERAL W WO CONTRAST INC CAD  Result Date: 11/07/2020 CLINICAL DATA:  70 year old female with possible LEFT breast masses on recent CT and mammogram/ultrasound. History of bilateral silicone breast implants. LABS:  None performed today EXAM: BILATERAL BREAST MRI WITH AND WITHOUT CONTRAST TECHNIQUE: Multiplanar, multisequence MR images of both breasts were obtained prior to and following the intravenous administration of 6 ml of Gadavist Three-dimensional MR images were rendered by post-processing of the original MR data on an independent workstation. The three-dimensional MR images were interpreted, and findings are reported in the following complete MRI report for this study. Three dimensional images were evaluated at the independent interpreting workstation using the DynaCAD thin client. COMPARISON:  10/23/2020 mammogram and ultrasound, 10/15/2020 chest CT and prior studies FINDINGS: Breast composition: b. Scattered fibroglandular tissue. Background parenchymal enhancement: Minimal Right breast: A retropectoral implant is noted with intracapsular rupture. No extracapsular rupture is identified. A 7 mm enhancing mass within the LOWER INNER RIGHT breast, just anterior to the pectoralis muscle  is unchanged mammographically from remote studies. No suspicious mass or enhancement noted. Left breast: A retropectoral implant with noted with intracapsular and extracapsular rupture.  Focal areas of extra capsular silicone is identified along the INFERIOR, LATERAL and SUPERIOR aspects of the implant, corresponding to the recent CT and ultrasound findings. No suspicious mass or enhancement is identified. Lymph nodes: No suspicious lymph nodes are noted. Ancillary findings:  None. IMPRESSION: 1. 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 2. Intracapsular rupture of the RIGHT retropectoral implant. 3. No MR findings suggestive of breast malignancy. RECOMMENDATION: Consider clinical follow-up as indicated for implant rupture. Bilateral screening mammogram in 2 months to resume annual mammogram schedule. BI-RADS CATEGORY  2: Benign. Electronically Signed   By: Margarette Canada M.D.   On: 11/07/2020 15:27    Impression: Stage IB, grade 2, endometrioid endometrial cancer with high risk features (deep myometrial invasion, LVSI, and isolated tumor cells)  Patient was fitted for a vaginal cylinder. The patient will be treated with a 2.0 cm diameter cylinder with a treatment length of 2.5 cm. This distended the vaginal vault without undue discomfort. The patient tolerated the procedure well.  The patient was successfully fitted for a vaginal cylinder. The patient is appropriate to begin vaginal brachytherapy.   Plan: The patient will proceed with CT simulation and vaginal brachytherapy today.    _______________________________   [name], PhD, MD  This document serves as a record of services personally performed by [name], MD. It was created on his behalf by Clerance Lav, a trained medical scribe. The creation of this record is based on the scribe's personal observations and the provider's statements to them. This document has been checked and approved by the attending provider.

## 2020-12-03 NOTE — Progress Notes (Signed)
Patient is here today for new [place] fitting/simulation and treatment.  Patient denies having any pain.  Reports only bladder concern is not feeling like she is emptying her bladder completely.  She denies having any diarrhea presently but has noticed that sometimes what she eats does flair it back up.  She has had some weight loss since 10/04/2020 in the amount of 7 lbs.  She contributes this to the excessive diarrhea.  Denies having to use the Imodium now.  Reports very rare reoccurrence of the rectal itching that she had before.   Appetite is good but she is cautious with what she eats.  Energy level is good.  Vitals:   12/03/20 0759  BP: 122/73  Pulse: 67  Resp: 18  Temp: (!) 97.1 F (36.2 C)  TempSrc: Temporal  SpO2: 100%  Weight: 118 lb (53.5 kg)  Height: 5\' 3"  (1.6 m)

## 2020-12-09 ENCOUNTER — Telehealth: Payer: Self-pay | Admitting: *Deleted

## 2020-12-09 NOTE — Progress Notes (Signed)
  Radiation Oncology         [phone] ________________________________  Name: Paula Newman MRN: [id number]  Date: 12/10/2020  DOB: 04-Feb-1951  CC: [name], MD  [name], MD  HDR BRACHYTHERAPY NOTE  DIAGNOSIS: Stage IB, grade 2, endometrioid endometrial cancer with high risk features (deep myometrial invasion, LVSI, and isolated tumor cells)   Simple treatment device note: Patient had construction of her custom vaginal cylinder. She will be treated with a 2.0 cm diameter segmented cylinder. This conforms to her anatomy without undue discomfort.  Vaginal brachytherapy procedure node: The patient was brought to the [place] suite. Identity was confirmed. All relevant records and images related to the planned course of therapy were reviewed. The patient freely provided informed written consent to proceed with treatment after reviewing the details related to the planned course of therapy. The consent form was witnessed and verified by the simulation staff. Then, the patient was set-up in a stable reproducible supine position for radiation therapy. Pelvic exam revealed the vaginal cuff to be intact 6. The patient's custom vaginal cylinder was placed in the proximal vagina. This was affixed to the CT/MR stabilization plate to prevent slippage. Patient tolerated the placement well.  Verification simulation note:  A fiducial marker was placed within the vaginal cylinder. An AP and lateral film was then obtained through the pelvis area. This documented accurate position of the vaginal cylinder for treatment.  HDR BRACHYTHERAPY TREATMENT  The remote afterloading device was affixed to the vaginal cylinder by catheter. Patient then proceeded to undergo her second high-dose-rate treatment directed at the proximal vagina. The patient was prescribed a dose of 6.0 gray to be delivered to the mucosal surface. Treatment length was 2.5 cm. Patient was treated with 1 channel using 6 dwell positions.  Treatment time was 148.5 seconds. Iridium 192 was the high-dose-rate source for treatment. The patient tolerated the treatment well. After completion of her therapy, a radiation survey was performed documenting return of the iridium source into the GammaMed safe.   PLAN: The patient will return next week for her third high-dose-rate treatment. ________________________________    [name], PhD, MD  This document serves as a record of services personally performed by [name], MD. It was created on his behalf by Clerance Lav, a trained medical scribe. The creation of this record is based on the scribe's personal observations and the provider's statements to them. This document has been checked and approved by the attending provider.

## 2020-12-09 NOTE — Telephone Encounter (Signed)
CALLED PATIENT TO REMIND OF HDR [place] 12-10-20 @ 10 AM, SPOKE WITH PATENT AND SHE IS AWARE OF THIS [place].

## 2020-12-10 ENCOUNTER — Other Ambulatory Visit: Payer: Self-pay

## 2020-12-10 ENCOUNTER — Ambulatory Visit
Admission: RE | Admit: 2020-12-10 | Discharge: 2020-12-10 | Disposition: A | Discharge status: Home / Self Care | Payer: Medicare Other | Source: Ambulatory Visit | Attending: Radiation Oncology | Admitting: Radiation Oncology

## 2020-12-10 DIAGNOSIS — C541 Malignant neoplasm of endometrium: Secondary | ICD-10-CM | POA: Diagnosis not present

## 2020-12-10 DIAGNOSIS — Z510 Encounter for antineoplastic radiation therapy: Secondary | ICD-10-CM | POA: Diagnosis not present

## 2020-12-16 ENCOUNTER — Telehealth: Payer: Self-pay | Admitting: *Deleted

## 2020-12-16 NOTE — Progress Notes (Signed)
  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 She will be treated with a 2.0 cm diameter segmented cylinder. This conforms to her anatomy without undue discomfort.  Vaginal brachytherapy procedure node: The patient was brought to the [place] suite. Identity was confirmed. All relevant records and images related to the planned course of therapy were reviewed. The patient freely provided informed written consent to proceed with treatment after reviewing the details related to the planned course of therapy. The consent form was witnessed and verified by the simulation staff. Then, the patient was set-up in a stable reproducible supine position for radiation therapy. Pelvic exam revealed the vaginal cuff to be intact. The patient's custom vaginal cylinder was placed in the proximal vagina. This was affixed to the CT/MR stabilization plate to prevent slippage. Patient tolerated the placement well.  Verification simulation note:  A fiducial marker was placed within the vaginal cylinder. An AP and lateral film was then obtained through the pelvis area. This documented accurate position of the vaginal cylinder for treatment.  HDR BRACHYTHERAPY TREATMENT  The remote afterloading device was affixed to the vaginal cylinder by catheter. Patient then proceeded to undergo her third high-dose-rate treatment directed at the proximal vagina. The patient was prescribed a dose of 6.0 gray to be delivered to the mucosal surface. Treatment length was 2.5 cm. Patient was treated with 1 channel using 6 dwell positions.  Treatment time was 158.6 seconds. Iridium 192 was the high-dose-rate source for treatment. The patient tolerated the treatment well. After completion of her therapy, a radiation survey was performed documenting return of the iridium source into the [employer] safe.   PLAN: She has completed her adjuvant radiation therapy and  will follow-up with radiation oncology in one month. ________________________________    [name], PhD, MD  This document serves as a record of services personally performed by [name], MD. It was created on his behalf by Clerance Lav, a trained medical scribe. The creation of this record is based on the scribe's personal observations and the provider's statements to them. This document has been checked and approved by the attending provider.

## 2020-12-16 NOTE — Telephone Encounter (Signed)
CALLED PATIENT TO REMIND OF HDR [facility] 12-17-20, SPOKE WITH PATIENT AND SHE IS AWARE OF THIS [place].

## 2020-12-17 ENCOUNTER — Encounter: Payer: Self-pay | Admitting: Radiation Oncology

## 2020-12-17 ENCOUNTER — Other Ambulatory Visit: Payer: Self-pay

## 2020-12-17 ENCOUNTER — Ambulatory Visit
Admission: RE | Admit: 2020-12-17 | Discharge: 2020-12-17 | Disposition: A | Discharge status: Home / Self Care | Payer: Medicare Other | Source: Ambulatory Visit | Attending: Radiation Oncology | Admitting: Radiation Oncology

## 2020-12-17 DIAGNOSIS — C541 Malignant neoplasm of endometrium: Secondary | ICD-10-CM | POA: Diagnosis not present

## 2020-12-17 DIAGNOSIS — Z510 Encounter for antineoplastic radiation therapy: Secondary | ICD-10-CM | POA: Insufficient documentation

## 2020-12-26 DIAGNOSIS — T8549XA Other mechanical complication of breast prosthesis and implant, initial encounter: Secondary | ICD-10-CM | POA: Diagnosis not present

## 2020-12-26 DIAGNOSIS — Z9889 Other specified postprocedural states: Secondary | ICD-10-CM | POA: Diagnosis not present

## 2020-12-26 DIAGNOSIS — Z9013 Acquired absence of bilateral breasts and nipples: Secondary | ICD-10-CM | POA: Diagnosis not present

## 2020-12-26 NOTE — H&P (Signed)
Subjective:     Patient ID: Paula Newman is a 70 y.o. female.  HPI  Here for follow up discussion prior to planned bilateral caps ulectomies, removal ruptured implants, and replacement bilateral breast implant. Rupture noted on CT chest for staging work up of endometrial cancer left breast nodularities. Subsequent left diagnostic MMG/US, MRI breast as below.  Underwent hysterecomy/BSO for endometrial ca and completed adjuvant RT 3.1.22.  Underwent bilateral subcutaneous mastectomies with immediate silicone implant subpectoral reconstruction with Dr. Lubertha Basque in [place] in 1984 for fibrocystic disease. Pathology from mastectomies benign. Underwent surgery for removal/replacement ruptured implants in 1990. Prior to recent imaging was happy with reconstruction and had no concerns. She has completed screening MMG yearly. Current implants Surgitek 310 cc High Profile, [id number] bilateral. Mastectomy weights reported as 156 g.  Wt stable  MMG 12/2019 normal . Sister with breast cancer  CLINICAL DATA: Possible left breast mass seen on CT imaging. The patient has a history of breast implants. She did have a set of implants removed many years ago due to rupture.  EXAM: DIGITAL DIAGNOSTIC LEFT MAMMOGRAM WITH IMPLANTS AND TOMO  ULTRASOUND LEFT BREAST  The patient has retropectoral implants. Standard and implant displaced views were performed.  COMPARISON: Previous exam(s).  ACR Breast Density Category a: The breast tissue is almost entirely fatty.  FINDINGS: Review of the CT imaging suggests the possible masses adjacent to the left implant may be due to extracapsular silicone. There is also suggestion of a possible focus of extracapsular silicone on the right. There is suggestion of a possible right implant rupture on CT imaging although the findings are not specific.  High attenuation material along the periphery of the patient's implant is consistent with  extracapsular silicone which could be due to current rupture or the reported previous fracture. No other suspicious mammographic findings on the left.  On physical exam, no suspicious lumps are identified.  Targeted ultrasound is performed, showing several hypoechoic masses along the lateral and inferior aspects of the left breast implant. The largest hypoechoic masslike region measures 11 x 11 by 25 mm at 6 o'clock, 7 cm from the nipple. A smaller adjacent region measures 8 mm in greatest dimension at 5:30, 7 cm from the nipple. Other similar regions are seen as well. While not classic, there is suggestion of snowstorm appearance in the region of these nodules/masses. There is also a stepladder appearance associated with the left implant suggesting current rupture.  IMPRESSION: The nodule/masses seen on CT imaging and today's ultrasound are all favored to represent sequela of implant rupture with extracapsular silicone. There is suggestion of a current rupture on the left on today's ultrasound and a possible rupture on the right based on CT imaging.  RECOMMENDATION: Recommend breast MRI for further evaluation of the implants and the Peri-implant nodules/masses. If the patient cannot have MRI, which is highly suggested, recommend six-month follow-up ultrasound.  I have discussed the findings and recommendations with the patient. If applicable, a reminder letter will be sent to the patient regarding the next appointment.  BI-RADS CATEGORY 3: Probably benign.   Electronically Signed By: Dorise Bullion III M.D On: 10/23/2020 12:48   CLINICAL DATA: 70 year old female with possible LEFT breast masses on recent CT and mammogram/ultrasound. History of bilateral silicone breast implants.  EXAM: BILATERAL BREAST MRI WITH AND WITHOUT CONTRAST  TECHNIQUE: Multiplanar, multisequence MR images of both breasts were obtained prior to and following the intravenous  administration of 6 ml of Gadavist  Three-dimensional MR images were rendered  by post-processing of the original MR data on an independent workstation. The three-dimensional MR images were interpreted, and findings are reported in the following complete MRI report for this study. Three dimensional images were evaluated at the independent interpreting workstation using the DynaCAD thin client.  COMPARISON: 10/23/2020 mammogram and ultrasound, 10/15/2020 chest CT and prior studies  FINDINGS: Breast composition: b. Scattered fibroglandular tissue.  Background parenchymal enhancement: Minimal  Right breast: A retropectoral implant is noted with intracapsular rupture. No extracapsular rupture is identified.  A 7 mm enhancing mass within the LOWER INNER RIGHT breast, just anterior to the pectoralis muscle is unchanged mammographically from remote studies.  No suspicious mass or enhancement noted.  Left breast: A retropectoral implant with noted with intracapsular and extracapsular rupture. Focal areas of extra capsular silicone is identified along the INFERIOR, LATERAL and SUPERIOR aspects of the implant, corresponding to the recent CT and ultrasound findings.  No suspicious mass or enhancement is identified.  Lymph nodes: No suspicious lymph nodes are noted.  Ancillary findings: None.  IMPRESSION: 1. 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 2. Intracapsular rupture of the RIGHT retropectoral implant. 3. No MR findings suggestive of breast malignancy.  RECOMMENDATION: Consider clinical follow-up as indicated for implant rupture.  Bilateral screening mammogram in 2 months to resume annual mammogram schedule.  BI-RADS CATEGORY 2: Benign.   Electronically Signed By: Margarette Canada M.D. On: 11/07/2020 15:27  Review of  Systems Remainder 12 point review negative    Objective:   Physical Exam Cardiovascular:     Rate and Rhythm: Normal rate and regular rhythm.     Heart sounds: Normal heart sounds.  Pulmonary:     Effort: Pulmonary effort is normal.     Breath sounds: Normal breath sounds.  Chest:  Breasts:     Right: No axillary adenopathy.     Left: No axillary adenopathy.    Lymphadenopathy:     Upper Body:     Right upper body: No axillary adenopathy.     Left upper body: No axillary adenopathy.  Skin:    Comments: Fitzpatrick 2     Breasts: No palpable masses, Baker 3 bilateral, soft tissue ptosis over implants bilateral Bilateral animation with depressed contour over LOQ bilateral Lateral breast transverse scars SN to nipple R 19 L 19 cm BW R 14 L 14 cm Nipple to IMF R 5 L 5 cm Soft tissue pinch 3 cm bilateral     Assessment:     History bilateral subcutaneous mastectomies with subpectoral implant reconstruction Bilateral silicone rupture implants- RIGHT intracapsular, LEFT extracapsular    Plan:     Reviewed in setting of subcutaneous mastectomy, there is some degree breast tissue still present and recommend continued screening MMG and BSE.  Discussed intra vs extracapsular rupture silicone. Counseled though silicone rupture including extracapsular is not known to be dangerous, rupture and extracapsular silicone can cause silicone granulomas that present as palpable masses and can make interfere with MMG- both of which can may following from cancer perspective difficult.  Recommend removal ruptured implants- to facilitate this plan complete capsulectomy as able, can use prior scar to approach. This alone would leave very little soft tissue and would be significant change aesthetically. Patient has not had concerns with animation and if pursues implant replacement would plan to leave in submuscular position. Reviewed implants are not permanent devices and future rupture is a  risk with replacement.  Reviewed incidence rupture with currently available implants. Counseled she needs to consider if she wants to undergo more surgery as she ages related to her implants. Reviewed options replacement with saline- reviewed in case rupture she would go flat and would not need to remove implant shell necessarily. Reviewed current FDA recommendations for silicone implant screening for rupture. If she pursues silicone implant replacement discussed [place] here in office for implant surveillance. Reviewed this would not replace screening MMG. She has elected for replacement silicone implants.  Reviewed her current implants are no longer available. Plan smooth round silicone implants. Completed [facility] physician patient checklist.  Additional risks including but not limited to bleeding hematoma seroma damage to adjacent structures needs for additional procedures blood clots in legs or lungs unacceptable cosmetic result reviewed.  Participating in [place] vaccine trial and discussed timing of this with regards to surgery.  Reviewed OP surgery, drains, post op limitations and recovery.   Drain teaching completed. Rx for tramadol and doxycycline given.

## 2020-12-31 ENCOUNTER — Other Ambulatory Visit: Payer: Self-pay

## 2020-12-31 ENCOUNTER — Encounter (HOSPITAL_BASED_OUTPATIENT_CLINIC_OR_DEPARTMENT_OTHER): Payer: Self-pay | Admitting: Plastic Surgery

## 2020-12-31 NOTE — Pre-Procedure Instructions (Signed)

## 2021-01-03 ENCOUNTER — Other Ambulatory Visit (HOSPITAL_COMMUNITY)
Admission: RE | Admit: 2021-01-03 | Discharge: 2021-01-03 | Disposition: A | Discharge status: Home / Self Care | Payer: Medicare Other | Source: Ambulatory Visit | Attending: Plastic Surgery | Admitting: Plastic Surgery

## 2021-01-03 DIAGNOSIS — Z20822 Contact with and (suspected) exposure to covid-19: Secondary | ICD-10-CM | POA: Diagnosis not present

## 2021-01-03 DIAGNOSIS — Z01812 Encounter for preprocedural laboratory examination: Secondary | ICD-10-CM | POA: Diagnosis not present

## 2021-01-03 LAB — SARS CORONAVIRUS 2 (TAT 6-24 HRS): SARS Coronavirus 2: NEGATIVE

## 2021-01-07 ENCOUNTER — Ambulatory Visit (HOSPITAL_BASED_OUTPATIENT_CLINIC_OR_DEPARTMENT_OTHER): Payer: Medicare Other | Admitting: Certified Registered"

## 2021-01-07 ENCOUNTER — Other Ambulatory Visit: Payer: Self-pay

## 2021-01-07 ENCOUNTER — Encounter (HOSPITAL_BASED_OUTPATIENT_CLINIC_OR_DEPARTMENT_OTHER)
Admission: RE | Disposition: A | Discharge status: Home / Self Care | Payer: Self-pay | Source: Home / Self Care | Attending: Plastic Surgery

## 2021-01-07 ENCOUNTER — Encounter (HOSPITAL_BASED_OUTPATIENT_CLINIC_OR_DEPARTMENT_OTHER): Payer: Self-pay | Admitting: Plastic Surgery

## 2021-01-07 ENCOUNTER — Ambulatory Visit (HOSPITAL_BASED_OUTPATIENT_CLINIC_OR_DEPARTMENT_OTHER)
Admission: RE | Admit: 2021-01-07 | Discharge: 2021-01-07 | Disposition: A | Discharge status: Home / Self Care | Payer: Medicare Other | Attending: Plastic Surgery | Admitting: Plastic Surgery

## 2021-01-07 DIAGNOSIS — Z803 Family history of malignant neoplasm of breast: Secondary | ICD-10-CM | POA: Diagnosis not present

## 2021-01-07 DIAGNOSIS — Z421 Encounter for breast reconstruction following mastectomy: Secondary | ICD-10-CM | POA: Diagnosis not present

## 2021-01-07 DIAGNOSIS — Z853 Personal history of malignant neoplasm of breast: Secondary | ICD-10-CM | POA: Insufficient documentation

## 2021-01-07 DIAGNOSIS — X58XXXA Exposure to other specified factors, initial encounter: Secondary | ICD-10-CM | POA: Diagnosis not present

## 2021-01-07 DIAGNOSIS — Z9013 Acquired absence of bilateral breasts and nipples: Secondary | ICD-10-CM | POA: Diagnosis not present

## 2021-01-07 DIAGNOSIS — Z45812 Encounter for adjustment or removal of left breast implant: Secondary | ICD-10-CM | POA: Diagnosis not present

## 2021-01-07 DIAGNOSIS — E785 Hyperlipidemia, unspecified: Secondary | ICD-10-CM | POA: Diagnosis not present

## 2021-01-07 DIAGNOSIS — T8549XA Other mechanical complication of breast prosthesis and implant, initial encounter: Secondary | ICD-10-CM | POA: Insufficient documentation

## 2021-01-07 DIAGNOSIS — Z45811 Encounter for adjustment or removal of right breast implant: Secondary | ICD-10-CM | POA: Diagnosis not present

## 2021-01-07 HISTORY — PX: BREAST IMPLANT REMOVAL: SHX5361

## 2021-01-07 HISTORY — PX: PLACEMENT OF BREAST IMPLANTS: SHX6334

## 2021-01-07 SURGERY — REMOVAL, IMPLANT, BREAST
Anesthesia: General | Site: Breast | Laterality: Bilateral

## 2021-01-07 MED ORDER — OXYCODONE HCL 5 MG PO TABS: 5.0000 mg | ORAL_TABLET | Freq: Once | ORAL | Status: DC | PRN

## 2021-01-07 MED ORDER — HEMOSTATIC AGENTS (NO CHARGE) OPTIME
TOPICAL | Status: DC | PRN
  Administered 2021-01-07: 1 via TOPICAL

## 2021-01-07 MED ORDER — PROPOFOL 500 MG/50ML IV EMUL
INTRAVENOUS | Status: AC
  Filled 2021-01-07: qty 50

## 2021-01-07 MED ORDER — PROMETHAZINE HCL 12.5 MG PO TABS: 12.5000 mg | ORAL_TABLET | Freq: Four times a day (QID) | ORAL | 0 refills | Status: DC | PRN

## 2021-01-07 MED ORDER — PROPOFOL 10 MG/ML IV BOLUS
INTRAVENOUS | Status: AC
  Filled 2021-01-07: qty 20

## 2021-01-07 MED ORDER — BUPIVACAINE HCL (PF) 0.5 % IJ SOLN
INTRAMUSCULAR | Status: DC | PRN
  Administered 2021-01-07: 30 mL

## 2021-01-07 MED ORDER — ONDANSETRON HCL 4 MG/2ML IJ SOLN
INTRAMUSCULAR | Status: AC
  Filled 2021-01-07: qty 2

## 2021-01-07 MED ORDER — SUGAMMADEX SODIUM 500 MG/5ML IV SOLN
INTRAVENOUS | Status: AC
  Filled 2021-01-07: qty 5

## 2021-01-07 MED ORDER — ACETAMINOPHEN 500 MG PO TABS
1000.0000 mg | ORAL_TABLET | ORAL | Status: AC
  Administered 2021-01-07: 1000 mg via ORAL

## 2021-01-07 MED ORDER — CHLORHEXIDINE GLUCONATE CLOTH 2 % EX PADS: 6.0000 | MEDICATED_PAD | Freq: Once | CUTANEOUS | Status: DC

## 2021-01-07 MED ORDER — ROCURONIUM BROMIDE 10 MG/ML (PF) SYRINGE
PREFILLED_SYRINGE | INTRAVENOUS | Status: AC
  Filled 2021-01-07: qty 10

## 2021-01-07 MED ORDER — LIDOCAINE HCL (CARDIAC) PF 100 MG/5ML IV SOSY
PREFILLED_SYRINGE | INTRAVENOUS | Status: DC | PRN
  Administered 2021-01-07: 50 mg via INTRATRACHEAL

## 2021-01-07 MED ORDER — SUGAMMADEX SODIUM 500 MG/5ML IV SOLN
INTRAVENOUS | Status: DC | PRN
  Administered 2021-01-07: 100 mg via INTRAVENOUS

## 2021-01-07 MED ORDER — LIDOCAINE 2% (20 MG/ML) 5 ML SYRINGE
INTRAMUSCULAR | Status: AC
  Filled 2021-01-07: qty 5

## 2021-01-07 MED ORDER — FENTANYL CITRATE (PF) 100 MCG/2ML IJ SOLN
25.0000 ug | INTRAMUSCULAR | Status: DC | PRN
  Administered 2021-01-07: 25 ug via INTRAVENOUS

## 2021-01-07 MED ORDER — DEXAMETHASONE SODIUM PHOSPHATE 10 MG/ML IJ SOLN
INTRAMUSCULAR | Status: DC | PRN
  Administered 2021-01-07: 6 mg via INTRAVENOUS

## 2021-01-07 MED ORDER — POVIDONE-IODINE 10 % EX SOLN
CUTANEOUS | Status: DC | PRN
  Administered 2021-01-07: 1 via TOPICAL

## 2021-01-07 MED ORDER — CEFAZOLIN SODIUM-DEXTROSE 2-4 GM/100ML-% IV SOLN
INTRAVENOUS | Status: AC
  Filled 2021-01-07: qty 100

## 2021-01-07 MED ORDER — FENTANYL CITRATE (PF) 100 MCG/2ML IJ SOLN
INTRAMUSCULAR | Status: AC
  Filled 2021-01-07: qty 2

## 2021-01-07 MED ORDER — FENTANYL CITRATE (PF) 100 MCG/2ML IJ SOLN
INTRAMUSCULAR | Status: DC | PRN
  Administered 2021-01-07: 50 ug via INTRAVENOUS
  Administered 2021-01-07 (×2): 25 ug via INTRAVENOUS

## 2021-01-07 MED ORDER — PROPOFOL 10 MG/ML IV BOLUS
INTRAVENOUS | Status: DC | PRN
  Administered 2021-01-07: 50 mg via INTRAVENOUS
  Administered 2021-01-07 (×3): 20 mg via INTRAVENOUS
  Administered 2021-01-07: 30 mg via INTRAVENOUS
  Administered 2021-01-07 (×3): 20 mg via INTRAVENOUS
  Administered 2021-01-07: 90 mg via INTRAVENOUS
  Administered 2021-01-07: 50 mg via INTRAVENOUS

## 2021-01-07 MED ORDER — 0.9 % SODIUM CHLORIDE (POUR BTL) OPTIME
TOPICAL | Status: DC | PRN
  Administered 2021-01-07: 1000 mL

## 2021-01-07 MED ORDER — DEXAMETHASONE SODIUM PHOSPHATE 10 MG/ML IJ SOLN
INTRAMUSCULAR | Status: AC
  Filled 2021-01-07: qty 1

## 2021-01-07 MED ORDER — ACETAMINOPHEN 160 MG/5ML PO SOLN: 1000.0000 mg | Freq: Once | ORAL | Status: DC | PRN

## 2021-01-07 MED ORDER — ACETAMINOPHEN 500 MG PO TABS: 1000.0000 mg | ORAL_TABLET | Freq: Once | ORAL | Status: DC | PRN

## 2021-01-07 MED ORDER — LACTATED RINGERS IV SOLN: INTRAVENOUS | Status: DC

## 2021-01-07 MED ORDER — ROCURONIUM BROMIDE 100 MG/10ML IV SOLN
INTRAVENOUS | Status: DC | PRN
  Administered 2021-01-07: 50 mg via INTRAVENOUS
  Administered 2021-01-07: 20 mg via INTRAVENOUS

## 2021-01-07 MED ORDER — ONDANSETRON HCL 4 MG/2ML IJ SOLN
INTRAMUSCULAR | Status: DC | PRN
  Administered 2021-01-07: 4 mg via INTRAVENOUS

## 2021-01-07 MED ORDER — ACETAMINOPHEN 10 MG/ML IV SOLN: 1000.0000 mg | Freq: Once | INTRAVENOUS | Status: DC | PRN

## 2021-01-07 MED ORDER — SODIUM CHLORIDE 0.9 % IV SOLN
INTRAVENOUS | Status: AC
  Filled 2021-01-07 (×2): qty 10

## 2021-01-07 MED ORDER — PROPOFOL 500 MG/50ML IV EMUL
INTRAVENOUS | Status: DC | PRN
  Administered 2021-01-07: 120 ug/kg/min via INTRAVENOUS

## 2021-01-07 MED ORDER — GABAPENTIN 300 MG PO CAPS
300.0000 mg | ORAL_CAPSULE | ORAL | Status: AC
  Administered 2021-01-07: 300 mg via ORAL

## 2021-01-07 MED ORDER — GABAPENTIN 300 MG PO CAPS
ORAL_CAPSULE | ORAL | Status: AC
  Filled 2021-01-07: qty 1

## 2021-01-07 MED ORDER — OXYCODONE HCL 5 MG/5ML PO SOLN: 5.0000 mg | Freq: Once | ORAL | Status: DC | PRN

## 2021-01-07 MED ORDER — SODIUM CHLORIDE 0.9 % IV SOLN
INTRAVENOUS | Status: DC | PRN
  Administered 2021-01-07: 1000 mL

## 2021-01-07 MED ORDER — ACETAMINOPHEN 500 MG PO TABS
ORAL_TABLET | ORAL | Status: AC
  Filled 2021-01-07: qty 2

## 2021-01-07 MED ORDER — CEFAZOLIN SODIUM-DEXTROSE 2-4 GM/100ML-% IV SOLN
2.0000 g | INTRAVENOUS | Status: AC
  Administered 2021-01-07: 2 g via INTRAVENOUS

## 2021-01-07 SURGICAL SUPPLY — 81 items
ADH SKN CLS APL DERMABOND .7 (GAUZE/BANDAGES/DRESSINGS) ×2
APL PRP STRL LF DISP 70% ISPRP (MISCELLANEOUS) ×2
BAG DECANTER FOR FLEXI CONT (MISCELLANEOUS) ×4 IMPLANT
BINDER BREAST LRG (GAUZE/BANDAGES/DRESSINGS) ×2 IMPLANT
BINDER BREAST MEDIUM (GAUZE/BANDAGES/DRESSINGS) IMPLANT
BINDER BREAST XLRG (GAUZE/BANDAGES/DRESSINGS) IMPLANT
BINDER BREAST XXLRG (GAUZE/BANDAGES/DRESSINGS) IMPLANT
BLADE SURG 10 STRL SS (BLADE) ×4 IMPLANT
BLADE SURG 15 STRL LF DISP TIS (BLADE) IMPLANT
BLADE SURG 15 STRL SS (BLADE)
BNDG ELASTIC 6X5.8 VLCR STR LF (GAUZE/BANDAGES/DRESSINGS) IMPLANT
BNDG GAUZE ELAST 4 BULKY (GAUZE/BANDAGES/DRESSINGS) IMPLANT
CANISTER SUCT 1200ML W/VALVE (MISCELLANEOUS) ×4 IMPLANT
CHLORAPREP W/TINT 26 (MISCELLANEOUS) ×4 IMPLANT
COVER BACK TABLE 60X90IN (DRAPES) ×2 IMPLANT
COVER MAYO STAND STRL (DRAPES) ×2 IMPLANT
COVER WAND RF STERILE (DRAPES) IMPLANT
DECANTER SPIKE VIAL GLASS SM (MISCELLANEOUS) IMPLANT
DERMABOND ADVANCED (GAUZE/BANDAGES/DRESSINGS) ×2
DERMABOND ADVANCED .7 DNX12 (GAUZE/BANDAGES/DRESSINGS) ×2 IMPLANT
DRAIN CHANNEL 15F RND FF W/TCR (WOUND CARE) ×4 IMPLANT
DRAPE INCISE IOBAN 66X45 STRL (DRAPES) ×2 IMPLANT
DRAPE TOP ARMCOVERS (MISCELLANEOUS) ×2 IMPLANT
DRAPE U-SHAPE 76X120 STRL (DRAPES) ×2 IMPLANT
DRAPE UTILITY XL STRL (DRAPES) ×2 IMPLANT
DRSG PAD ABDOMINAL 8X10 ST (GAUZE/BANDAGES/DRESSINGS) ×4 IMPLANT
ELECT BLADE 4.0 EZ CLEAN MEGAD (MISCELLANEOUS) ×2
ELECT BLADE 6.5 EXT (BLADE) IMPLANT
ELECT COATED BLADE 2.86 ST (ELECTRODE) ×2 IMPLANT
ELECT REM PT RETURN 9FT ADLT (ELECTROSURGICAL) ×2
ELECTRODE BLDE 4.0 EZ CLN MEGD (MISCELLANEOUS) ×1 IMPLANT
ELECTRODE REM PT RTRN 9FT ADLT (ELECTROSURGICAL) ×1 IMPLANT
EVACUATOR SILICONE 100CC (DRAIN) ×4 IMPLANT
GAUZE SPONGE 4X4 12PLY STRL (GAUZE/BANDAGES/DRESSINGS) IMPLANT
GAUZE SPONGE 4X4 12PLY STRL LF (GAUZE/BANDAGES/DRESSINGS) IMPLANT
GLOVE SURG HYDRASOFT LTX SZ5.5 (GLOVE) ×4 IMPLANT
GOWN STRL REUS W/ TWL LRG LVL3 (GOWN DISPOSABLE) ×2 IMPLANT
GOWN STRL REUS W/TWL LRG LVL3 (GOWN DISPOSABLE) ×4
HEMOSTAT ARISTA ABSORB 3G PWDR (HEMOSTASIS) ×4 IMPLANT
IMPL BREAST GEL 365CC (Breast) ×2 IMPLANT
IMPLANT BREAST GEL 365CC (Breast) ×4 IMPLANT
IV NS 500ML (IV SOLUTION)
IV NS 500ML BAXH (IV SOLUTION) IMPLANT
KIT FILL SYSTEM UNIVERSAL (SET/KITS/TRAYS/PACK) IMPLANT
MARKER SKIN DUAL TIP RULER LAB (MISCELLANEOUS) IMPLANT
NEEDLE FILTER BLUNT 18X 1/2SAF (NEEDLE)
NEEDLE FILTER BLUNT 18X1 1/2 (NEEDLE) IMPLANT
NEEDLE HYPO 25X1 1.5 SAFETY (NEEDLE) ×2 IMPLANT
NS IRRIG 1000ML POUR BTL (IV SOLUTION) ×4 IMPLANT
PACK BASIN DAY SURGERY FS (CUSTOM PROCEDURE TRAY) ×2 IMPLANT
PENCIL SMOKE EVACUATOR (MISCELLANEOUS) ×2 IMPLANT
PIN SAFETY STERILE (MISCELLANEOUS) ×2 IMPLANT
SHEET MEDIUM DRAPE 40X70 STRL (DRAPES) ×4 IMPLANT
SIZER BREAST REUSE 345CC (SIZER) ×2
SIZER BREAST REUSE 365CC (SIZER) ×2
SIZER BRST REUSE 345CC (SIZER) ×1 IMPLANT
SIZER BRST REUSE 365CC (SIZER) ×1 IMPLANT
SLEEVE SCD COMPRESS KNEE MED (STOCKING) ×2 IMPLANT
SPONGE LAP 18X18 RF (DISPOSABLE) ×8 IMPLANT
STAPLER VISISTAT 35W (STAPLE) ×2 IMPLANT
STRIP CLOSURE SKIN 1/2X4 (GAUZE/BANDAGES/DRESSINGS) IMPLANT
SUT ETHILON 2 0 FS 18 (SUTURE) ×2 IMPLANT
SUT MNCRL AB 4-0 PS2 18 (SUTURE) ×4 IMPLANT
SUT PDS 3-0 CT2 (SUTURE)
SUT PDS AB 2-0 CT2 27 (SUTURE) ×2 IMPLANT
SUT PDS II 3-0 CT2 27 ABS (SUTURE) IMPLANT
SUT VIC AB 3-0 PS1 18 (SUTURE)
SUT VIC AB 3-0 PS1 18XBRD (SUTURE) IMPLANT
SUT VIC AB 3-0 SH 27 (SUTURE) ×4
SUT VIC AB 3-0 SH 27X BRD (SUTURE) ×2 IMPLANT
SUT VICRYL 4-0 PS2 18IN ABS (SUTURE) ×4 IMPLANT
SWAB COLLECTION DEVICE MRSA (MISCELLANEOUS) IMPLANT
SWAB CULTURE ESWAB REG 1ML (MISCELLANEOUS) IMPLANT
SYR 20ML LL LF (SYRINGE) ×2 IMPLANT
SYR 50ML LL SCALE MARK (SYRINGE) IMPLANT
SYR BULB IRRIG 60ML STRL (SYRINGE) ×4 IMPLANT
SYR CONTROL 10ML LL (SYRINGE) ×2 IMPLANT
TOWEL GREEN STERILE FF (TOWEL DISPOSABLE) ×2 IMPLANT
TUBE CONNECTING 20X1/4 (TUBING) ×4 IMPLANT
UNDERPAD 30X36 HEAVY ABSORB (UNDERPADS AND DIAPERS) ×4 IMPLANT
YANKAUER SUCT BULB TIP NO VENT (SUCTIONS) ×2 IMPLANT

## 2021-01-07 NOTE — Op Note (Signed)
Operative Note   DATE OF OPERATION: 3.22.22  LOCATION: [facility]-outpatient  SURGICAL DIVISION: Plastic Surgery  PREOPERATIVE DIAGNOSES:  1. Acquired absence breasts 2. Bilateral mechanical complication breast prosthesis  POSTOPERATIVE DIAGNOSES:  same  PROCEDURE:  1. Removal bilateral ruptured breast implants, capsulectomies 2. Placement bilateral silicone implants  SURGEON: Irene Limbo MD MBA  ASSISTANT: none  ANESTHESIA:  General.   EBL: 25 ml  COMPLICATIONS: None immediate.   INDICATIONS FOR PROCEDURE:  The patient, Paula Newman, is a 70 y.o. female born on 06-May-1951, is here for removal bilateral ruptured breast implants.    FINDINGS: Right intracapsular and left extracapsular rupture noted. Calcified capsule present bilateral. Near complete capsulectomy performed bilateral. Removed smooth silicone implant with label "SCL 310 HP" on each. Natrelle Soft Touch Full Projection Smooth Round 365 ml implants placed bilateral. REF SSF-365 RIGHT SN [id number] LEFT SN [id number]  DESCRIPTION OF PROCEDURE:  The patient's operative site was marked with the patient in the preoperative area. The patient was taken to the operating room. SCDs were placed and IV antibiotics were given. The patient's operative site was prepped and draped in a sterile fashion. A time out was performed and all information was confirmed to be correct. Incision made in right radial scar and carried through superficial fascia to pectoralis major muscle. Skin flaps elevated of pectoralis and serratus muscle in limited fashion in caudal direction to expose implant capsule devoid of overlying muscle. Dissection completed inferior to pectoralis muscle over capsule surface. Incision made in capsule and implant and free silicone removed. Posterior capsule elevated off chest wall with cautery and blunt dissection. Cavity irrigated with saline and hemostasis obtained. Local anesthetic infiltrated.  I then directed  attention to left chest. Incision made in right radial scar and carried through superficial fascia to pectoralis major muscle. Skin flaps elevated of pectoralis and serratus muscle in limited fashion in caudal direction to expose implant capsule devoid of overlying muscle. Dissection completed inferior to pectoralis muscle over capsule surface. Incision made in capsule and implant and free silicone removed. Gross extracapsular rupture noted in multiple locations. Posterior capsule elevated off chest wall with cautery and blunt dissection. Cavity irrigated with saline and hemostasis obtained. Local anesthetic infiltrated. Sizer placed in each chest.  Patient brought to upright sitting position. Full Projection 365 ml implants selected for bilateral placement. Patient returned to supine position. Each cavity irrigated with saline solution containing Ancef, gentamicin and Betadine. 15 Rr JP placed in each cavity and secured with 2-0 nylon. Arista power applied to each cavity.   Implant placed in right chest. Implant orientation ensured. Closure completed with 3-0 vicryl for approximation muscle layer and superficial fascia. 4-0 vicryl used to approximate dermis followed by 4-0 monocryl subcuticular skin closure. mplant placed in left chest. Implant orientation ensured. Closure completed with 3-0 vicryl for approximation muscle layer and superficial fascia. 4-0 vicryl used to approximate dermis followed by 4-0 monocryl subcuticular skin closure. Dermabond applied to incisions. Dry dressing and breast binder applied.   The patient was allowed to wake from anesthesia, extubated and taken to the recovery room in satisfactory condition.   SPECIMENS: right and left capsule  DRAINS: 15 Fr JP in right and left breast

## 2021-01-07 NOTE — Anesthesia Procedure Notes (Signed)
Procedure Name: Intubation Performed by: Ezequiel Kayser, CRNA Pre-anesthesia Checklist: Patient identified, Emergency Drugs available, Suction available and Patient being monitored Patient Re-evaluated:Patient Re-evaluated prior to induction Oxygen Delivery Method: Circle System Utilized Preoxygenation: Pre-oxygenation with 100% oxygen Induction Type: IV induction Ventilation: Mask ventilation without difficulty Laryngoscope Size: Mac and 3 Grade View: Grade I Tube type: Oral Number of attempts: 1 Airway Equipment and Method: Stylet Placement Confirmation: ETT inserted through vocal cords under direct vision,  positive ETCO2 and breath sounds checked- equal and bilateral Secured at: 21 cm Tube secured with: Tape Dental Injury: Teeth and Oropharynx as per pre-operative assessment

## 2021-01-07 NOTE — Interval H&P Note (Signed)
History and Physical Interval Note:  01/07/2021 11:53 AM  Paula Newman  has presented today for surgery, with the diagnosis of Mechanical complication breast implant bilateral, acquired absence breasts, history breast cancer.  The various methods of treatment have been discussed with the patient and family. After consideration of risks, benefits and other options for treatment, the patient has consented to  Procedure(s): REMOVAL BREAST IMPLANTS, CAPSULECTOMIES (Bilateral) PLACEMENT OF BREAST IMPLANTS (Bilateral) as a surgical intervention.  The patient's history has been reviewed, patient examined, no change in status, stable for surgery.  I have reviewed the patient's chart and labs.  Questions were answered to the patient's satisfaction.     Arnoldo Hooker Skipper Dacosta

## 2021-01-07 NOTE — Anesthesia Preprocedure Evaluation (Signed)
Anesthesia Evaluation  Patient identified by MRN, date of birth, ID band Patient awake    Reviewed: Allergy & Precautions, NPO status , Patient's Chart, lab work & pertinent test results  History of Anesthesia Complications (+) PONV and history of anesthetic complications  Airway Mallampati: I  TM Distance: >3 FB Neck ROM: Full    Dental  (+) Teeth Intact, Dental Advisory Given   Pulmonary neg pulmonary ROS,    breath sounds clear to auscultation       Cardiovascular negative cardio ROS   Rhythm:Regular     Neuro/Psych negative neurological ROS  negative psych ROS   GI/Hepatic negative GI ROS, Neg liver ROS,   Endo/Other  negative endocrine ROS  Renal/GU negative Renal ROS     Musculoskeletal negative musculoskeletal ROS (+)   Abdominal   Peds  Hematology negative hematology ROS (+)   Anesthesia Other Findings   Reproductive/Obstetrics                             Anesthesia Physical Anesthesia Plan  ASA: I  Anesthesia Plan: General   Post-op Pain Management:    Induction: Intravenous  PONV Risk Score and Plan: 4 or greater and Propofol infusion, TIVA, Ondansetron and Dexamethasone  Airway Management Planned: Oral ETT  Additional Equipment: None  Intra-op Plan:   Post-operative Plan: Extubation in OR  Informed Consent: I have reviewed the patients History and Physical, chart, labs and discussed the procedure including the risks, benefits and alternatives for the proposed anesthesia with the patient or authorized representative who has indicated his/her understanding and acceptance.     Dental advisory given  Plan Discussed with: CRNA and Surgeon  Anesthesia Plan Comments:         Anesthesia Quick Evaluation

## 2021-01-07 NOTE — Discharge Instructions (Signed)
No Tylenol until 6pm if needed   Post Anesthesia Home Care Instructions  Activity: Get plenty of rest for the remainder of the day. A responsible individual must stay with you for 24 hours following the procedure.  For the next 24 hours, DO NOT: -Drive a car -Paediatric nurse -Drink alcoholic beverages -Take any medication unless instructed by your physician -Make any legal decisions or sign important papers.  Meals: Start with liquid foods such as gelatin or soup. Progress to regular foods as tolerated. Avoid greasy, spicy, heavy foods. If nausea and/or vomiting occur, drink only clear liquids until the nausea and/or vomiting subsides. Call your physician if vomiting continues.  Special Instructions/Symptoms: Your throat may feel dry or sore from the anesthesia or the breathing tube placed in your throat during surgery. If this causes discomfort, gargle with warm salt water. The discomfort should disappear within 24 hours.  If you had a scopolamine patch placed behind your ear for the management of post- operative nausea and/or vomiting:  1. The medication in the patch is effective for 72 hours, after which it should be removed.  Wrap patch in a tissue and discard in the trash. Wash hands thoroughly with soap and water. 2. You may remove the patch earlier than 72 hours if you experience unpleasant side effects which may include dry mouth, dizziness or visual disturbances. 3. Avoid touching the patch. Wash your hands with soap and water after contact with the patch.    About my Jackson-Pratt Bulb Drain  What is a Jackson-Pratt bulb? A Jackson-Pratt is a soft, round device used to collect drainage. It is connected to a long, thin drainage catheter, which is held in place by one or two small stiches near your surgical incision site. When the bulb is squeezed, it forms a vacuum, forcing the drainage to empty into the bulb.  Emptying the Jackson-Pratt bulb- To empty the bulb: 1. Release  the plug on the top of the bulb. 2. Pour the bulb's contents into a measuring container which your nurse will provide. 3. Record the time emptied and amount of drainage. Empty the drain(s) as often as your     doctor or nurse recommends.  Date                  Time                    Amount (Drain 1)                 Amount (Drain 2)  _____________________________________________________________________  _____________________________________________________________________  _____________________________________________________________________  _____________________________________________________________________  _____________________________________________________________________  _____________________________________________________________________  _____________________________________________________________________  _____________________________________________________________________  Squeezing the Jackson-Pratt Bulb- To squeeze the bulb: 1. Make sure the plug at the top of the bulb is open. 2. Squeeze the bulb tightly in your fist. You will hear air squeezing from the bulb. 3. Replace the plug while the bulb is squeezed. 4. Use a safety pin to attach the bulb to your clothing. This will keep the catheter from     pulling at the bulb insertion site.  When to call your doctor- Call your doctor if:  Drain site becomes red, swollen or hot.  You have a fever greater than 101 degrees F.  There is oozing at the drain site.  Drain falls out (apply a guaze bandage over the drain hole and secure it with tape).  Drainage increases daily not related to activity patterns. (You will usually have more drainage when you are active than  when you are resting.)  Drainage has a bad odor.

## 2021-01-07 NOTE — Anesthesia Postprocedure Evaluation (Signed)
Anesthesia Post Note  Patient: Paula Newman  Procedure(s) Performed: REMOVAL BREAST IMPLANTS, CAPSULECTOMIES (Bilateral Breast) PLACEMENT OF BREAST IMPLANTS (Bilateral Breast)     Patient location during evaluation: PACU Anesthesia Type: General Level of consciousness: awake and alert Pain management: pain level controlled Vital Signs Assessment: post-procedure vital signs reviewed and stable Respiratory status: spontaneous breathing, nonlabored ventilation and respiratory function stable Cardiovascular status: blood pressure returned to baseline and stable Postop Assessment: no apparent nausea or vomiting Anesthetic complications: no   No complications documented.  Last Vitals:  Vitals:   01/07/21 1630 01/07/21 1715  BP: 128/80 (!) 145/83  Pulse: 75 75  Resp: 12 18  Temp:  36.7 C  SpO2: 100% 100%    Last Pain:  Vitals:   01/07/21 1715  TempSrc:   PainSc: 3                  Lynda Rainwater

## 2021-01-07 NOTE — Transfer of Care (Signed)
Immediate Anesthesia Transfer of Care Note  Patient: Paula Newman  Procedure(s) Performed: REMOVAL BREAST IMPLANTS, CAPSULECTOMIES (Bilateral Breast) PLACEMENT OF BREAST IMPLANTS (Bilateral Breast)  Patient Location: PACU  Anesthesia Type:General  Level of Consciousness: drowsy  Airway & Oxygen Therapy: Patient Spontanous Breathing and Patient connected to face mask oxygen  Post-op Assessment: Report given to RN and Post -op Vital signs reviewed and stable  Post vital signs: Reviewed and stable  Last Vitals:  Vitals Value Taken Time  BP 135/80 01/07/21 1615  Temp    Pulse 79 01/07/21 1617  Resp 15 01/07/21 1617  SpO2 100 % 01/07/21 1617  Vitals shown include unvalidated device data.  Last Pain:  Vitals:   01/07/21 1124  TempSrc: Oral  PainSc: 0-No pain      Patients Stated Pain Goal: 4 (54/86/28 2417)  Complications: No complications documented.

## 2021-01-08 ENCOUNTER — Encounter (HOSPITAL_BASED_OUTPATIENT_CLINIC_OR_DEPARTMENT_OTHER): Payer: Self-pay | Admitting: Plastic Surgery

## 2021-01-09 LAB — SURGICAL PATHOLOGY

## 2021-01-14 ENCOUNTER — Encounter: Payer: Self-pay | Admitting: Radiation Oncology

## 2021-01-20 ENCOUNTER — Encounter: Payer: Self-pay | Admitting: Radiation Oncology

## 2021-01-20 ENCOUNTER — Other Ambulatory Visit: Payer: Self-pay

## 2021-01-20 ENCOUNTER — Ambulatory Visit
Admission: RE | Admit: 2021-01-20 | Discharge: 2021-01-20 | Disposition: A | Discharge status: Home / Self Care | Payer: Medicare Other | Source: Ambulatory Visit | Attending: Radiation Oncology | Admitting: Radiation Oncology

## 2021-01-20 DIAGNOSIS — Z923 Personal history of irradiation: Secondary | ICD-10-CM | POA: Diagnosis not present

## 2021-01-20 DIAGNOSIS — E785 Hyperlipidemia, unspecified: Secondary | ICD-10-CM | POA: Diagnosis not present

## 2021-01-20 DIAGNOSIS — Z79899 Other long term (current) drug therapy: Secondary | ICD-10-CM | POA: Insufficient documentation

## 2021-01-20 DIAGNOSIS — C541 Malignant neoplasm of endometrium: Secondary | ICD-10-CM | POA: Insufficient documentation

## 2021-01-20 HISTORY — DX: Personal history of irradiation: Z92.3

## 2021-01-20 NOTE — Progress Notes (Signed)
She is currently in no pain.  Patient complains of None. Reports none None.    Patient states they urinate 1 - 2 times per night.   Patient reports None, a bowel movement every 2 day.   Patient reports None.    Patient was given vaginal dilator and educated on use and care of dilator.  BP 98/72 (BP Location: Right Arm, Patient Position: Sitting, Cuff Size: Normal)   Pulse 70   Temp 97.6 F (36.4 C)   Resp 18   Ht 5\' 3"  (1.6 m)   Wt 115 lb 9.6 oz (52.4 kg)   SpO2 100%   BMI 20.48 kg/m

## 2021-01-20 NOTE — Progress Notes (Incomplete)
  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 She did report some constipation that was managed with Miralax, rectal stinging/itching, very mild nausea, some diarrhea managed with Imodium, and some lower abdominal tenderness. Appetite remained stable. She denied vaginal pain/pressure, hematochezia, hematuria, fatigue, dysuria, and increased urinary frequency.  Plan: The patient will follow-up with radiation oncology in one month.  ________________________________________________   [name], PhD, MD  This document serves as a record of services personally performed by [name], MD. It was created on his behalf by Clerance Lav, a trained medical scribe. The creation of this record is based on the scribe's personal observations and the provider's statements to them. This document has been checked and approved by the attending provider.

## 2021-01-20 NOTE — Progress Notes (Signed)
Radiation Oncology         [phone] ________________________________  Name: Paula Newman MRN: [id number]  Date: 01/20/2021  DOB: 25-Mar-1951  Follow-Up Visit Note  CC: [name], MD  Everitt Amber, MD    ICD-10-CM   1. 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 Since the end of treatment, she underwent removal of bilateral ruptured breast implants and placement of bilateral silicone implants on 85/27/7824 under the care of Dr. Iran Planas. Pathology from the procedure revealed membranous fibro-connective tissue with fat necrosis and dystrophic calcifications.  On review of systems, she reports her energy level at 95% of baseline. She denies vaginal bleeding or discharge pelvic pain or abdominal bloating.  She denies any urinary symptoms or bowel complaints..                 ALLERGIES:  is allergic to coconut oil and codeine.  Meds: Current Outpatient Medications  Medication Sig Dispense Refill  . ezetimibe (ZETIA) 10 MG tablet Take 10 mg by mouth daily.    . simvastatin (ZOCOR) 20 MG tablet Take 10 mg by mouth daily at 6 PM.    . promethazine (PHENERGAN) 12.5 MG tablet Take 1 tablet (12.5 mg total) by mouth every 6 (six) hours as needed for nausea or vomiting. (Patient not taking: Reported on 01/20/2021) 10 tablet 0   No current facility-administered medications for this encounter.    Physical Findings: The  patient is in no acute distress. Patient is alert and oriented.  height is 5\' 3"  (1.6 m) and weight is 115 lb 9.6 oz (52.4 kg). Her temperature is 97.6 F (36.4 C). Her blood pressure is 98/72 and her pulse is 70. Her respiration is 18 and oxygen saturation is 100%.  Lungs are clear to auscultation bilaterally. Heart has regular rate and rhythm. No palpable cervical, supraclavicular, or axillary adenopathy. Abdomen soft, non-tender, normal bowel sounds. Pelvic exam deferred in light of recent treatment.   Lab Findings: Lab Results  Component Value Date   WBC 6.0 09/03/2020   HGB 12.8 09/03/2020   HCT 38.7 09/03/2020   MCV 89.4 09/03/2020   PLT 283 09/03/2020    Radiographic Findings: No results found.  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  Plan: The patient will follow up with Dr. Denman George in 2 months and with radiation oncology in 5 months.  Today the patient was given a vaginal dilator and instructions on its use in light of her pelvic external beam radiation therapy and vaginal brachytherapy treatments.   ____________________________________   [name], PhD, MD  This document serves as a record of services personally performed by [name], MD. It was created on his behalf by Clerance Lav, a trained medical scribe. The creation of this record is based on the scribe's personal observations and the provider's statements  to them. This document has been checked and approved by the attending provider.  

## 2021-01-24 ENCOUNTER — Telehealth: Payer: Self-pay | Admitting: *Deleted

## 2021-01-24 NOTE — Telephone Encounter (Signed)
Spoke with the patient and scheduled a follow up appt for 6/17

## 2021-01-27 DIAGNOSIS — Z1389 Encounter for screening for other disorder: Secondary | ICD-10-CM | POA: Diagnosis not present

## 2021-01-27 DIAGNOSIS — E785 Hyperlipidemia, unspecified: Secondary | ICD-10-CM | POA: Diagnosis not present

## 2021-01-27 DIAGNOSIS — Z8542 Personal history of malignant neoplasm of other parts of uterus: Secondary | ICD-10-CM | POA: Diagnosis not present

## 2021-01-27 DIAGNOSIS — Z0000 Encounter for general adult medical examination without abnormal findings: Secondary | ICD-10-CM | POA: Diagnosis not present

## 2021-01-27 DIAGNOSIS — M810 Age-related osteoporosis without current pathological fracture: Secondary | ICD-10-CM | POA: Diagnosis not present

## 2021-02-13 ENCOUNTER — Other Ambulatory Visit: Payer: Self-pay | Admitting: Plastic Surgery

## 2021-02-13 DIAGNOSIS — Z1231 Encounter for screening mammogram for malignant neoplasm of breast: Secondary | ICD-10-CM

## 2021-02-25 DIAGNOSIS — D649 Anemia, unspecified: Secondary | ICD-10-CM | POA: Diagnosis not present

## 2021-04-04 ENCOUNTER — Encounter: Payer: Self-pay | Admitting: Gynecologic Oncology

## 2021-04-04 ENCOUNTER — Inpatient Hospital Stay: Payer: Medicare Other | Attending: Gynecologic Oncology | Admitting: Gynecologic Oncology

## 2021-04-04 ENCOUNTER — Other Ambulatory Visit: Payer: Self-pay

## 2021-04-04 VITALS — BP 116/73 | HR 72 | Temp 97.1°F | Resp 16 | Ht 63.0 in | Wt 118.6 lb

## 2021-04-04 DIAGNOSIS — Z90722 Acquired absence of ovaries, bilateral: Secondary | ICD-10-CM | POA: Diagnosis not present

## 2021-04-04 DIAGNOSIS — Z923 Personal history of irradiation: Secondary | ICD-10-CM | POA: Insufficient documentation

## 2021-04-04 DIAGNOSIS — Z90710 Acquired absence of both cervix and uterus: Secondary | ICD-10-CM | POA: Insufficient documentation

## 2021-04-04 DIAGNOSIS — Z8542 Personal history of malignant neoplasm of other parts of uterus: Secondary | ICD-10-CM

## 2021-04-04 DIAGNOSIS — Z8541 Personal history of malignant neoplasm of cervix uteri: Secondary | ICD-10-CM | POA: Insufficient documentation

## 2021-04-04 DIAGNOSIS — C541 Malignant neoplasm of endometrium: Secondary | ICD-10-CM

## 2021-04-04 NOTE — Progress Notes (Signed)
Gynecologic Oncology Follow-up Note   Chief Complaint:  Chief Complaint  Patient presents with   Endometrial cancer [facility])    Assessment/Plan:  [name]  is a 70 y.o.  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 MMR normal.  High/intermediate risk factors for recurrence. External beam radiation and vaginal brachytherapy completed 12/17/20.  She has no evidence of disease recurrence.  She will continue alternating visits with Dr Sondra Come and Stites every 3 months until March, 2024.  I discussed that I will be leaving the cancer center and she will follow-up with my partner, Dr Berline Lopes in December.   HPI: [name] is a 70 year old P 1 who was seen in consultation at the request of Dr Ulanda Edison for evaluation and treatment of grade 1 endometrial cancer.   Her symptoms began in October 2021 with vaginal bleeding which was postmenopausal.  She saw her primary care physician for a symptom related visit who referred her to see Dr. Ulanda Edison (she did not have a gynecologist that she received GYN care from her PCP) on August 07, 2020.  Work-up of symptoms included a transvaginal ultrasound and endometrial Pipelle and Pap smear. Transvaginal US on July 30, 2020 showed a uterus measuring 7.7 x 3.9 x 5.3 cm with an endometrial thickness of 23 mm. 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 Pap testing was normal on 08/07/2020. On 09/10/20 she underwent robotic assisted total hysterectomy, BSO, SLN biopsy. Intraoperative findings were significant for a 6cm normal appearing uterus with normal tubes and ovaries and no gross extrauterine disease. Surgery was uncomplicated.  Final pathology revealed a FIGO grade 2 endometrioid endometrial adenocarcinoma with deep  myometrial invasion (36mm of 72mm thickness) with LVSI present. The adnexa and cervix were free of tumor. However, there were isolated tumor cells in both SLN's. MMR testing was in tact with MSS phenotype. This was staged as a stage IB grade 2 endometrial cancer with high/intermediate risk factors for recurrence and adjuvant radiation was recommended in accordance with NCCN guidelines.   Interval Hx:  She received adjuvant radiation (whole pelvic RT and vaginal brachytherapy) between 10/24/20 through 12/17/20. She received IMRT with 45 Gy to the pelvis in 25 fractions and 3 fractions x 6 Gy (total 18 Gy) to the vagina via brachytherapy.   She tolerated the radiation well and denied toxicities of bowel and bladder.  She has no symptoms of recurrence.  She is sexually active.     Current Meds:  Outpatient Encounter Medications as of 04/04/2021  Medication Sig   ezetimibe (ZETIA) 10 MG tablet Take 10 mg by mouth daily.   simvastatin (ZOCOR) 20 MG tablet Take 10 mg by mouth daily at 6 PM.   [DISCONTINUED] promethazine (PHENERGAN) 12.5 MG tablet Take 1 tablet (12.5 mg total) by mouth every 6 (six) hours as needed for nausea or vomiting. (Patient not taking: Reported on 01/20/2021)   No facility-administered encounter medications on file as of 04/04/2021.    Allergy:  Allergies  Allergen Reactions   Coconut Oil Nausea And Vomiting   Codeine Nausea And Vomiting and Other (See Comments)    Social Hx:   Social History   Socioeconomic History   Marital status: Married    Spouse name: Not on file   Number of children: 1   Years of education:  Not on file   Highest education level: Not on file  Occupational History   Not on file  Tobacco Use   Smoking status: Never   Smokeless tobacco: Never  Vaping Use   Vaping Use: Never used  Substance and Sexual Activity   Alcohol use: Not Currently    Comment: rare occasion   2 glasses a year   Drug use: Never   Sexual activity: Not Currently     Birth control/protection: None  Other Topics Concern   Not on file  Social History Narrative   Not on file   Social Determinants of Health   Financial Resource Strain: Not on file  Food Insecurity: Not on file  Transportation Needs: Not on file  Physical Activity: Not on file  Stress: Not on file  Social Connections: Not on file  Intimate Partner Violence: Not on file    Past Surgical Hx:  Past Surgical History:  Procedure Laterality Date   AUGMENTATION MAMMAPLASTY Bilateral    BREAST EXCISIONAL BIOPSY Bilateral    BREAST IMPLANT REMOVAL Bilateral 01/07/2021   Procedure: REMOVAL BREAST IMPLANTS, CAPSULECTOMIES;  Surgeon: Irene Limbo, MD;  Location: [place];  Service: Plastics;  Laterality: Bilateral;   MENISCUS REPAIR Bilateral    PLACEMENT OF BREAST IMPLANTS Bilateral 01/07/2021   Procedure: PLACEMENT OF BREAST IMPLANTS;  Surgeon: Irene Limbo, MD;  Location: [place];  Service: Plastics;  Laterality: Bilateral;   ROBOTIC ASSISTED TOTAL HYSTERECTOMY WITH BILATERAL SALPINGO OOPHERECTOMY Bilateral 09/10/2020   Procedure: XI ROBOTIC ASSISTED TOTAL HYSTERECTOMY WITH BILATERAL SALPINGO OOPHORECTOMY;  Surgeon: Everitt Amber, MD;  Location: [facility];  Service: Gynecology;  Laterality: Bilateral;   SENTINEL NODE BIOPSY N/A 09/10/2020   Procedure: SENTINEL NODE BIOPSY;  Surgeon: Everitt Amber, MD;  Location: [facility];  Service: Gynecology;  Laterality: N/A;    Past Medical Hx:  Past Medical History:  Diagnosis Date   Cancer ([place])    endometrial   Complication of anesthesia    slow to wake up   History of radiation therapy 12/03/2020-12/17/2020   vaginal brachytherapy      Dr [name]   History of radiation therapy 10/24/2020-11/27/2020   Pelvic IMRT   Dr [name]   Hyperlipidemia     Past Gynecological History:  See HPI, svd x 1, no hx of abnormal paps No LMP recorded. Patient has had a hysterectomy.  Family Hx:  Family History  Problem  Relation Age of Onset   Hypertension Mother    Hypertension Father    Breast cancer Sister 57   Breast cancer Paternal Grandmother     Review of Systems:  Constitutional  Feels well,    ENT Normal appearing ears and nares bilaterally Skin/Breast  No rash, sores, jaundice, itching, dryness Cardiovascular  No chest pain, shortness of breath, or edema  Pulmonary  No cough or wheeze.  Gastro Intestinal  No complaints  Genito Urinary  No frequency, urgency, dysuria, no bleeding Musculo Skeletal  No myalgia, arthralgia, joint swelling or pain  Neurologic  No weakness, numbness, change in gait,  Psychology  No depression, anxiety, insomnia.   Vitals:  Blood pressure 116/73, pulse 72, temperature (!) 97.1 F (36.2 C), resp. rate 16, height $RemoveBe'5\' 3"'[id number]$  (1.6 m), weight 118 lb 9.6 oz (53.8 kg), SpO2 99 %.  Physical Exam: WD in NAD Neck  Supple NROM, without any enlargements.  Lymph Node Survey No cervical supraclavicular or inguinal adenopathy Cardiovascular  Well perfused peripheries.   Lungs  No  increased WOB Skin  No rash/lesions/breakdown  Psychiatry  Alert and oriented to person, place, and time  Abdomen  Normoactive bowel sounds, abdomen soft, non-tender and nonobese without evidence of hernia. Soft incisions. Back No CVA tenderness Genito Urinary  Vulva/vagina: smooth, atrophic, narrow vagina. Rectal  deferred  Extremities  No bilateral cyanosis, clubbing or edema.  Thereasa Solo, MD  04/04/2021, 4:00 PM

## 2021-04-04 NOTE — Patient Instructions (Signed)
Please notify Dr Denman George at phone number [phone] if you notice vaginal bleeding, new pelvic or abdominal pains, bloating, feeling full easy, or a change in bladder or bowel function.   Please return to see Dr Sondra Come in 3 months.  Dr Denman George is departing the [place] [facility] in October, 2022. Her partners and colleagues including Dr Berline Lopes, Dr Delsa Sale and Joylene John, Nurse Practitioner will be available to continue your care.   You are next scheduled to return to the Gynecologic Oncology office at the [facility] in December with Dr Berline Lopes.

## 2021-04-29 ENCOUNTER — Other Ambulatory Visit: Payer: Self-pay

## 2021-04-29 ENCOUNTER — Ambulatory Visit
Admission: RE | Admit: 2021-04-29 | Discharge: 2021-04-29 | Disposition: A | Discharge status: Home / Self Care | Payer: Medicare Other | Source: Ambulatory Visit | Attending: Plastic Surgery | Admitting: Plastic Surgery

## 2021-04-29 DIAGNOSIS — Z1231 Encounter for screening mammogram for malignant neoplasm of breast: Secondary | ICD-10-CM

## 2021-05-05 DIAGNOSIS — N390 Urinary tract infection, site not specified: Secondary | ICD-10-CM | POA: Diagnosis not present

## 2021-05-15 ENCOUNTER — Emergency Department (HOSPITAL_COMMUNITY): Payer: No Typology Code available for payment source

## 2021-05-15 ENCOUNTER — Encounter (HOSPITAL_COMMUNITY): Payer: Self-pay

## 2021-05-15 ENCOUNTER — Emergency Department (HOSPITAL_COMMUNITY)
Admission: EM | Admit: 2021-05-15 | Discharge: 2021-05-15 | Disposition: A | Discharge status: Home / Self Care | Payer: No Typology Code available for payment source | Attending: Emergency Medicine | Admitting: Emergency Medicine

## 2021-05-15 ENCOUNTER — Other Ambulatory Visit: Payer: Self-pay

## 2021-05-15 DIAGNOSIS — Y92410 Unspecified street and highway as the place of occurrence of the external cause: Secondary | ICD-10-CM | POA: Diagnosis not present

## 2021-05-15 DIAGNOSIS — Z8542 Personal history of malignant neoplasm of other parts of uterus: Secondary | ICD-10-CM | POA: Diagnosis not present

## 2021-05-15 DIAGNOSIS — S299XXA Unspecified injury of thorax, initial encounter: Secondary | ICD-10-CM | POA: Diagnosis present

## 2021-05-15 DIAGNOSIS — S2222XA Fracture of body of sternum, initial encounter for closed fracture: Secondary | ICD-10-CM | POA: Diagnosis not present

## 2021-05-15 MED ORDER — TRAMADOL HCL 50 MG PO TABS: 50.0000 mg | ORAL_TABLET | Freq: Four times a day (QID) | ORAL | 0 refills | Status: DC | PRN
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
## 2021-06-26 ENCOUNTER — Encounter: Payer: Self-pay | Admitting: Radiation Oncology

## 2021-06-26 ENCOUNTER — Ambulatory Visit
Admission: RE | Admit: 2021-06-26 | Discharge: 2021-06-26 | Disposition: A | Discharge status: Home / Self Care | Payer: Medicare Other | Source: Ambulatory Visit | Attending: Radiation Oncology | Admitting: Radiation Oncology

## 2021-06-26 ENCOUNTER — Other Ambulatory Visit: Payer: Self-pay

## 2021-06-26 DIAGNOSIS — Z923 Personal history of irradiation: Secondary | ICD-10-CM | POA: Insufficient documentation

## 2021-06-26 DIAGNOSIS — R911 Solitary pulmonary nodule: Secondary | ICD-10-CM | POA: Diagnosis not present

## 2021-06-26 DIAGNOSIS — Z8542 Personal history of malignant neoplasm of other parts of uterus: Secondary | ICD-10-CM | POA: Diagnosis not present

## 2021-06-26 DIAGNOSIS — Z79899 Other long term (current) drug therapy: Secondary | ICD-10-CM | POA: Insufficient documentation

## 2021-06-26 DIAGNOSIS — Z08 Encounter for follow-up examination after completed treatment for malignant neoplasm: Secondary | ICD-10-CM | POA: Diagnosis not present

## 2021-06-26 DIAGNOSIS — C541 Malignant neoplasm of endometrium: Secondary | ICD-10-CM | POA: Diagnosis not present

## 2021-06-26 NOTE — Progress Notes (Signed)
Paula Newman is here today for follow up post radiation to the pelvic.  They completed their radiation on: 12/17/20  Does the patient complain of any of the following:  Pain:No Abdominal bloating: no Diarrhea/Constipation: no Nausea/Vomiting: no Vaginal Discharge: no Blood in Urine or Stool: no Urinary Issues (dysuria/incomplete emptying/ incontinence/ increased frequency/urgency): no Does patient report using vaginal dilator 2-3 times a week and/or sexually active 2-3 weeks: Yes, patient using dilator and is sexually active. Post radiation skin changes: intact   Additional comments if applicable:   Vitals:   06/26/21 1026  BP: 124/66  Pulse: 63  Resp: 18  Temp: 97.6 F (36.4 C)  SpO2: 99%  Weight: 118 lb 6.4 oz (53.7 kg)  Height: '5\' 4"'$  (1.626 m)

## 2021-06-27 DIAGNOSIS — H524 Presbyopia: Secondary | ICD-10-CM | POA: Diagnosis not present

## 2021-06-27 DIAGNOSIS — H43813 Vitreous degeneration, bilateral: Secondary | ICD-10-CM | POA: Diagnosis not present

## 2021-07-24 DIAGNOSIS — L578 Other skin changes due to chronic exposure to nonionizing radiation: Secondary | ICD-10-CM | POA: Diagnosis not present

## 2021-07-24 DIAGNOSIS — Z23 Encounter for immunization: Secondary | ICD-10-CM | POA: Diagnosis not present

## 2021-07-24 DIAGNOSIS — L814 Other melanin hyperpigmentation: Secondary | ICD-10-CM | POA: Diagnosis not present

## 2021-07-24 DIAGNOSIS — D225 Melanocytic nevi of trunk: Secondary | ICD-10-CM | POA: Diagnosis not present

## 2021-07-24 DIAGNOSIS — L821 Other seborrheic keratosis: Secondary | ICD-10-CM | POA: Diagnosis not present

## 2021-08-20 ENCOUNTER — Telehealth: Payer: Self-pay | Admitting: *Deleted

## 2021-08-20 DIAGNOSIS — H25013 Cortical age-related cataract, bilateral: Secondary | ICD-10-CM | POA: Diagnosis not present

## 2021-08-20 DIAGNOSIS — H2511 Age-related nuclear cataract, right eye: Secondary | ICD-10-CM | POA: Diagnosis not present

## 2021-08-20 DIAGNOSIS — H2513 Age-related nuclear cataract, bilateral: Secondary | ICD-10-CM | POA: Diagnosis not present

## 2021-08-20 DIAGNOSIS — H25043 Posterior subcapsular polar age-related cataract, bilateral: Secondary | ICD-10-CM | POA: Diagnosis not present

## 2021-08-20 DIAGNOSIS — I10 Essential (primary) hypertension: Secondary | ICD-10-CM | POA: Diagnosis not present

## 2021-08-20 NOTE — Telephone Encounter (Signed)
Patient called and moved her appt from 12/13 to 12/14

## 2021-08-22 DIAGNOSIS — K144 Atrophy of tongue papillae: Secondary | ICD-10-CM | POA: Diagnosis not present

## 2021-08-25 DIAGNOSIS — K6289 Other specified diseases of anus and rectum: Secondary | ICD-10-CM | POA: Diagnosis not present

## 2021-08-29 DIAGNOSIS — Z23 Encounter for immunization: Secondary | ICD-10-CM | POA: Diagnosis not present

## 2021-09-18 DIAGNOSIS — Z9889 Other specified postprocedural states: Secondary | ICD-10-CM | POA: Diagnosis not present

## 2021-09-18 DIAGNOSIS — Z9013 Acquired absence of bilateral breasts and nipples: Secondary | ICD-10-CM | POA: Diagnosis not present

## 2021-09-18 DIAGNOSIS — T8549XS Other mechanical complication of breast prosthesis and implant, sequela: Secondary | ICD-10-CM | POA: Diagnosis not present

## 2021-09-23 DIAGNOSIS — H25811 Combined forms of age-related cataract, right eye: Secondary | ICD-10-CM | POA: Diagnosis not present

## 2021-09-23 DIAGNOSIS — H2511 Age-related nuclear cataract, right eye: Secondary | ICD-10-CM | POA: Diagnosis not present

## 2021-09-23 DIAGNOSIS — H2512 Age-related nuclear cataract, left eye: Secondary | ICD-10-CM | POA: Diagnosis not present

## 2021-09-23 HISTORY — PX: CATARACT EXTRACTION: SUR2

## 2021-09-29 ENCOUNTER — Other Ambulatory Visit: Payer: Self-pay

## 2021-09-29 ENCOUNTER — Encounter: Payer: Self-pay | Admitting: Gynecologic Oncology

## 2021-09-29 ENCOUNTER — Inpatient Hospital Stay: Payer: Medicare Other | Attending: Gynecologic Oncology | Admitting: Gynecologic Oncology

## 2021-09-29 VITALS — BP 123/71 | HR 68 | Temp 97.8°F | Resp 16 | Ht 64.0 in | Wt 117.7 lb

## 2021-09-29 DIAGNOSIS — Z923 Personal history of irradiation: Secondary | ICD-10-CM | POA: Diagnosis not present

## 2021-09-29 DIAGNOSIS — C541 Malignant neoplasm of endometrium: Secondary | ICD-10-CM

## 2021-09-29 DIAGNOSIS — Z90710 Acquired absence of both cervix and uterus: Secondary | ICD-10-CM | POA: Diagnosis not present

## 2021-09-29 DIAGNOSIS — Z8542 Personal history of malignant neoplasm of other parts of uterus: Secondary | ICD-10-CM | POA: Insufficient documentation

## 2021-09-29 DIAGNOSIS — Z90722 Acquired absence of ovaries, bilateral: Secondary | ICD-10-CM | POA: Diagnosis not present

## 2021-09-29 NOTE — Patient Instructions (Signed)
It was a pleasure meeting you today.  I do not see or feel any evidence of cancer recurrence on your exam.  I will see you in 6 months, 3 months after your next visit with Dr. Sondra Come.  If you develop any of the symptoms that we discussed today between now and your next visit, please call to see me sooner.

## 2021-09-29 NOTE — Progress Notes (Signed)
Gynecologic Oncology Return Clinic Visit  09/29/2021  Reason for Visit: Surveillance visit in the setting of high-intermediate risk uterine cancer  Treatment History: Her symptoms began in October 2021 with vaginal bleeding which was postmenopausal.   She saw her primary care physician for a symptom related visit who referred her to see Dr. Ulanda Edison (she did not have a gynecologist that she received GYN care from her PCP) on August 07, 2020.  Transvaginal US on July 30, 2020 showed a uterus measuring 7.7 x 3.9 x 5.3 cm with an endometrial thickness of 23 mm.  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 Pap testing was normal on 08/07/2020.  On 09/10/20 she underwent robotic assisted total hysterectomy, BSO, SLN biopsy. Intraoperative findings were significant for a 6cm normal appearing uterus with normal tubes and ovaries and no gross extrauterine disease. Surgery was uncomplicated.   Final pathology revealed a FIGO grade 2 endometrioid endometrial adenocarcinoma with deep myometrial invasion (67mm of 34mm thickness) with LVSI present. The adnexa and cervix were free of tumor. However, there were isolated tumor cells in both SLN's. MMR testing was in tact with MSS phenotype. This was staged as a stage IB grade 2 endometrial cancer with high/intermediate risk factors for recurrence and adjuvant radiation was recommended in accordance with NCCN guidelines.   She received adjuvant radiation (whole pelvic RT and vaginal brachytherapy) between 10/24/20 through 12/17/20. She received IMRT with 45 Gy to the pelvis in 25 fractions and 3 fractions x 6 Gy (total 18 Gy) to the vagina via brachytherapy.   Interval History: Doing well.  Denies vaginal bleeding or discharge.  Continues to have some baseline constipation, unchanged, intermittent.  Uses MiraLAX for the symptoms.  Denies any urinary  symptoms.  Denies any pelvic or abdominal pain.  She saw Dr. Sondra Come on 9/8, was doing well and NED at that time.  Past Medical/Surgical History: Past Medical History:  Diagnosis Date   Cancer [facility])    endometrial   Complication of anesthesia    slow to wake up   History of radiation therapy 12/03/2020-12/17/2020   vaginal brachytherapy      Dr [name]   History of radiation therapy 10/24/2020-11/27/2020   Pelvic IMRT   Dr [name]   Hyperlipidemia     Past Surgical History:  Procedure Laterality Date   AUGMENTATION MAMMAPLASTY Bilateral    BREAST EXCISIONAL BIOPSY Bilateral    BREAST IMPLANT REMOVAL Bilateral 01/07/2021   Procedure: REMOVAL BREAST IMPLANTS, CAPSULECTOMIES;  Surgeon: Irene Limbo, MD;  Location: [place];  Service: Plastics;  Laterality: Bilateral;   CATARACT EXTRACTION Right 09/23/2021   MENISCUS REPAIR Bilateral    PLACEMENT OF BREAST IMPLANTS Bilateral 01/07/2021   Procedure: PLACEMENT OF BREAST IMPLANTS;  Surgeon: Irene Limbo, MD;  Location: [place];  Service: Plastics;  Laterality: Bilateral;   ROBOTIC ASSISTED TOTAL HYSTERECTOMY WITH BILATERAL SALPINGO OOPHERECTOMY Bilateral 09/10/2020   Procedure: XI ROBOTIC ASSISTED TOTAL HYSTERECTOMY WITH BILATERAL SALPINGO OOPHORECTOMY;  Surgeon: Everitt Amber, MD;  [facility];  Service: Gynecology;  Laterality: Bilateral;   SENTINEL NODE BIOPSY N/A 09/10/2020   Procedure: SENTINEL NODE BIOPSY;  Surgeon: Everitt Amber, MD;  [facility];  Service: Gynecology;  Laterality: N/A;    Family History  Problem Relation Age of Onset   Hypertension Mother    Hypertension Father    Breast cancer Sister 59   Breast cancer Paternal Grandmother  Social History   Socioeconomic History   Marital status: Married    Spouse name: Not on file   Number of children: 1   Years of education: Not on file   Highest education level: Not on file  Occupational History   Not on  file  Tobacco Use   Smoking status: Never   Smokeless tobacco: Never  Vaping Use   Vaping Use: Never used  Substance and Sexual Activity   Alcohol use: Not Currently    Comment: rare occasion   2 glasses a year   Drug use: Never   Sexual activity: Not Currently    Birth control/protection: None  Other Topics Concern   Not on file  Social History Narrative   Not on file   Social Determinants of Health   Financial Resource Strain: Not on file  Food Insecurity: Not on file  Transportation Needs: Not on file  Physical Activity: Not on file  Stress: Not on file  Social Connections: Not on file    Current Medications:  Current Outpatient Medications:    DUREZOL 0.05 % EMUL, Place 1 drop into the left eye 3 (three) times daily., Disp: , Rfl:    ezetimibe (ZETIA) 10 MG tablet, Take 10 mg by mouth daily., Disp: , Rfl:    gatifloxacin (ZYMAXID) 0.5 % SOLN, SMARTSIG:In Eye(s), Disp: , Rfl:    PROLENSA 0.07 % SOLN, Place 1 drop into the left eye 2 (two) times daily., Disp: , Rfl:    simvastatin (ZOCOR) 20 MG tablet, Take 10 mg by mouth daily at 6 PM., Disp: , Rfl:   Review of Systems: Denies appetite changes, fevers, chills, fatigue, unexplained weight changes. Denies hearing loss, neck lumps or masses, mouth sores, ringing in ears or voice changes. Denies cough or wheezing.  Denies shortness of breath. Denies chest pain or palpitations. Denies leg swelling. Denies abdominal distention, pain, blood in stools, constipation, diarrhea, nausea, vomiting, or early satiety. Denies pain with intercourse, dysuria, frequency, hematuria or incontinence. Denies hot flashes, pelvic pain, vaginal bleeding or vaginal discharge.   Denies joint pain, back pain or muscle pain/cramps. Denies itching, rash, or wounds. Denies dizziness, headaches, numbness or seizures. Denies swollen lymph nodes or glands, denies easy bruising or bleeding. Denies anxiety, depression, confusion, or decreased  concentration.  Physical Exam: BP 123/71 (BP Location: Left Arm, Patient Position: Sitting)   Pulse 68   Temp 97.8 F (36.6 C) (Oral)   Resp 16   Ht $R'5\' 4"'My$  (1.626 m)   Wt 117 lb 11.2 oz (53.4 kg)   SpO2 100%   BMI 20.20 kg/m  General: Alert, oriented, no acute distress. HEENT: Normocephalic, atraumatic, sclera anicteric. Chest: Clear to auscultation bilaterally.  No wheezes. Cardiovascular: Regular rate and rhythm, no murmurs. Abdomen: soft, nontender.  Normoactive bowel sounds.  No masses or hepatosplenomegaly appreciated. Well-healed incisions. Extremities: Grossly normal range of motion.  Warm, well perfused.  No edema bilaterally. Skin: No rashes or lesions noted. Lymphatics: No cervical, supraclavicular, or inguinal adenopathy. GU: Normal appearing external genitalia without erythema, excoriation, or lesions.  On speculum exam, mild vaginal atrophy and radiation changes noted.  Cuff intact.  No masses.  On bimanual exam, some scar tissue along the upper right vaginal sidewall, no nodularity or masses.  This was confirmed on bimanual exam.  Laboratory & Radiologic Studies: None new  Assessment & Plan: Paula Newman is a 70 y.o. woman with stage IB grade 2 endometrioid endometrial cancer with high risk features (deep myometrial invasion,  LVSI present and isolated tumor cells). MMR normal. High-intermediate risk factors. EBRT/VBT completed on 12/17/20.   Patient is overall doing well and is NED on exam today. Encouraged her to continue using vaginal dilator regularly.   Per NCCN and SGO surveillance recommendations, we will continue with surveillance visits every 3 months until 2 years out from completion of adjuvant therapy.  These visits will alternate between Dr. Sondra Come in our office.  We reviewed signs and symptoms again today that would be concerning for disease recurrence and should prompt a phone call before her next scheduled visit.  32 minutes of total time was spent for  this patient encounter, including preparation, face-to-face counseling with the patient and coordination of care, and documentation of the encounter.  Jeral Pinch, MD  Division of Gynecologic Oncology  Department of Obstetrics and Gynecology  [facility]

## 2021-09-30 ENCOUNTER — Ambulatory Visit: Payer: Medicare Other | Admitting: Gynecologic Oncology

## 2021-09-30 DIAGNOSIS — H2512 Age-related nuclear cataract, left eye: Secondary | ICD-10-CM | POA: Diagnosis not present

## 2021-10-07 DIAGNOSIS — H2512 Age-related nuclear cataract, left eye: Secondary | ICD-10-CM | POA: Diagnosis not present

## 2021-11-05 DIAGNOSIS — Z20828 Contact with and (suspected) exposure to other viral communicable diseases: Secondary | ICD-10-CM | POA: Diagnosis not present

## 2021-11-05 DIAGNOSIS — U071 COVID-19: Secondary | ICD-10-CM | POA: Diagnosis not present

## 2021-11-26 ENCOUNTER — Telehealth: Payer: Self-pay | Admitting: *Deleted

## 2021-11-26 NOTE — Telephone Encounter (Signed)
CALLED PATIENT TO ALTER FU APPT. ON 12-25-21 DUE TO DR. KINARD BEING OFF, RESCHEDULED FOR 01-01-22 @ 9:15 AM, LVM FOR A RETURN CALL

## 2021-11-28 ENCOUNTER — Telehealth: Payer: Self-pay | Admitting: *Deleted

## 2021-11-28 NOTE — Telephone Encounter (Addendum)
Patient called and stated "I was told by Dr Berline Lopes to call her if I had any issues. The last two times I had intercourse I experienced some spotting. It was only with wiping and it was pink in color. I have no pain. Do I need to come in to be seen?" Per patient ok to leave full message on machine. Explained that the message would be given to Dr Berline Lopes and Lenna Sciara APP; the office would call her back

## 2021-11-28 NOTE — Telephone Encounter (Signed)
Per Dr Berline Lopes, scheduled the patient for a follow up on 2/24 at 4 pm. Called and left a message for the patient with appt date/time

## 2021-12-01 ENCOUNTER — Encounter: Payer: Self-pay | Admitting: Gynecologic Oncology

## 2021-12-01 ENCOUNTER — Inpatient Hospital Stay: Payer: Medicare Other | Attending: Gynecologic Oncology | Admitting: Gynecologic Oncology

## 2021-12-01 ENCOUNTER — Telehealth: Payer: Self-pay

## 2021-12-01 ENCOUNTER — Other Ambulatory Visit: Payer: Self-pay

## 2021-12-01 VITALS — BP 124/62 | HR 70 | Temp 97.8°F | Resp 18 | Ht 61.81 in | Wt 117.6 lb

## 2021-12-01 DIAGNOSIS — Z90722 Acquired absence of ovaries, bilateral: Secondary | ICD-10-CM | POA: Insufficient documentation

## 2021-12-01 DIAGNOSIS — N930 Postcoital and contact bleeding: Secondary | ICD-10-CM | POA: Diagnosis not present

## 2021-12-01 DIAGNOSIS — I70203 Unspecified atherosclerosis of native arteries of extremities, bilateral legs: Secondary | ICD-10-CM | POA: Diagnosis not present

## 2021-12-01 DIAGNOSIS — L03031 Cellulitis of right toe: Secondary | ICD-10-CM | POA: Diagnosis not present

## 2021-12-01 DIAGNOSIS — Z8542 Personal history of malignant neoplasm of other parts of uterus: Secondary | ICD-10-CM | POA: Diagnosis not present

## 2021-12-01 DIAGNOSIS — L600 Ingrowing nail: Secondary | ICD-10-CM | POA: Diagnosis not present

## 2021-12-01 DIAGNOSIS — Z90710 Acquired absence of both cervix and uterus: Secondary | ICD-10-CM | POA: Insufficient documentation

## 2021-12-01 DIAGNOSIS — C541 Malignant neoplasm of endometrium: Secondary | ICD-10-CM

## 2021-12-01 DIAGNOSIS — Z923 Personal history of irradiation: Secondary | ICD-10-CM | POA: Insufficient documentation

## 2021-12-01 DIAGNOSIS — L02611 Cutaneous abscess of right foot: Secondary | ICD-10-CM | POA: Diagnosis not present

## 2021-12-01 DIAGNOSIS — N939 Abnormal uterine and vaginal bleeding, unspecified: Secondary | ICD-10-CM

## 2021-12-01 DIAGNOSIS — M21072 Valgus deformity, not elsewhere classified, left ankle: Secondary | ICD-10-CM | POA: Diagnosis not present

## 2021-12-01 DIAGNOSIS — M2041 Other hammer toe(s) (acquired), right foot: Secondary | ICD-10-CM | POA: Diagnosis not present

## 2021-12-01 NOTE — Telephone Encounter (Signed)
Received call from [name] this morning. Patient reports she is concerned with her symptoms and would like to see Dr. Berline Lopes earlier if possible. Appointment rescheduled for today at 1:15 pm. Patient is in agreement of appointment date and time.

## 2021-12-01 NOTE — Patient Instructions (Signed)
It was good to see you today.  I do not see or feel any abnormalities on your exam to suggest cancer recurrence.  Continue using lubrication with your dilator and intercourse.  Please let me know if you have any additional bleeding.

## 2021-12-01 NOTE — Progress Notes (Signed)
Gynecologic Oncology Return Clinic Visit  12/01/21  Reason for Visit: vaginal bleeding  Treatment History: Her symptoms began in October 2021 with vaginal bleeding which was postmenopausal.    She saw her primary care physician for a symptom related visit who referred her to see Dr. Ulanda Edison (she did not have a gynecologist that she received GYN care from her PCP) on August 07, 2020.   Transvaginal US on July 30, 2020 showed a uterus measuring 7.7 x 3.9 x 5.3 cm with an endometrial thickness of 23 mm.   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 Pap testing was normal on 08/07/2020.   On 09/10/20 she underwent robotic assisted total hysterectomy, BSO, SLN biopsy. Intraoperative findings were significant for a 6cm normal appearing uterus with normal tubes and ovaries and no gross extrauterine disease. Surgery was uncomplicated.    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 The adnexa and cervix were free of tumor. However, there were isolated tumor cells in both SLN's. MMR testing was in tact with MSS phenotype. This was staged as a stage IB grade 2 endometrial cancer with high/intermediate risk factors for recurrence and adjuvant radiation was recommended in accordance with NCCN guidelines.   She received adjuvant radiation (whole pelvic RT and vaginal brachytherapy) between 10/24/20 through 12/17/20. She received IMRT with 45 Gy to the pelvis in 25 fractions and 3 fractions x 6 Gy (total 18 Gy) to the vagina via brachytherapy.   Interval History: Patient presents to be seen after having very minimal vaginal spotting after intercourse on 2 occasions.  After both occasions, when she went to void, she had a spot of blood when she wiped once or twice.  She has had no further bleeding  since including after intercourse.  She denies any pain or cramping.  She continues to use her vaginal dilator regularly, often starting with the smallest size and then moving up to the medium size.  Past Medical/Surgical History: Past Medical History:  Diagnosis Date   [facility]   [facility]   Complication of anesthesia    slow to wake up   History of radiation therapy 12/03/2020-12/17/2020   vaginal brachytherapy      Dr JaGery Pray History of radiation therapy 10/24/2020-11/27/2020   Pelvic IMRT   Dr JaGery Pray Hyperlipidemia     Past Surgical History:  Procedure Laterality Date   AUGMENTATION MAMMAPLASTY Bilateral    BREAST EXCISIONAL BIOPSY Bilateral    BREAST IMPLANT REMOVAL Bilateral 01/07/2021   Procedure: REMOVAL BREAST IMPLANTS, CAPSULECTOMIES;  Surgeon: ThIrene LimboMD;  [facility];  Laterality: Bilateral;   CATARACT EXTRACTION Right 09/23/2021   MENISCUS REPAIR Bilateral    PLACEMENT OF BREAST IMPLANTS Bilateral 01/07/2021   Procedure: PLACEMENT OF BREAST IMPLANTS;  Surgeon: ThIrene LimboMD;  [facility];  Laterality: Bilateral;   ROBOTIC ASSISTED TOTAL HYSTERECTOMY WITH BILATERAL SALPINGO OOPHERECTOMY Bilateral 09/10/2020   Procedure: XI ROBOTIC ASSISTED TOTAL HYSTERECTOMY WITH BILATERAL SALPINGO OOPHORECTOMY;  Surgeon: RoEveritt AmberMD;  Location: [facility];  Service: Gynecology;  Laterality: Bilateral;   SENTINEL NODE BIOPSY N/A 09/10/2020   Procedure: SENTINEL NODE BIOPSY;  Surgeon: RoEveritt AmberMD;  Location: [facility];  Service: Gynecology;  Laterality: N/A;    Family History  Problem Relation Age  of Onset   Hypertension Mother    Hypertension Father    Breast cancer Sister 82   Breast cancer Paternal Grandmother     Social History   Socioeconomic History   Marital status: Married    Spouse name: Not on file   Number of children: 1   Years of education:  Not on file   Highest education level: Not on file  Occupational History   Not on file  Tobacco Use   Smoking status: Never   Smokeless tobacco: Never  Vaping Use   Vaping Use: Never used  Substance and Sexual Activity   Alcohol use: Not Currently    Comment: rare occasion   2 glasses a year   Drug use: Never   Sexual activity: Not Currently    Birth control/protection: None  Other Topics Concern   Not on file  Social History Narrative   Not on file   Social Determinants of Health   Financial Resource Strain: Not on file  Food Insecurity: Not on file  Transportation Needs: Not on file  Physical Activity: Not on file  Stress: Not on file  Social Connections: Not on file    Current Medications:  Current Outpatient Medications:    ezetimibe (ZETIA) 10 MG tablet, Take 10 mg by mouth daily., Disp: , Rfl:    promethazine-dextromethorphan (PROMETHAZINE-DM) 6.25-15 MG/5ML syrup, Take 5 mLs by mouth every 6 (six) hours., Disp: , Rfl:    simvastatin (ZOCOR) 20 MG tablet, Take 10 mg by mouth daily at 6 PM., Disp: , Rfl:   Review of Systems: Denies appetite changes, fevers, chills, fatigue, unexplained weight changes. Denies hearing loss, neck lumps or masses, mouth sores, ringing in ears or voice changes. Denies cough or wheezing.  Denies shortness of breath. Denies chest pain or palpitations. Denies leg swelling. Denies abdominal distention, pain, blood in stools, constipation, diarrhea, nausea, vomiting, or early satiety. Denies pain with intercourse, dysuria, frequency, hematuria or incontinence. Denies hot flashes, pelvic pain, or vaginal discharge.   Denies joint pain, back pain or muscle pain/cramps. Denies itching, rash, or wounds. Denies dizziness, headaches, numbness or seizures. Denies swollen lymph nodes or glands, denies easy bruising or bleeding. Denies anxiety, depression, confusion, or decreased concentration.  Physical Exam: BP 124/62 (BP Location: Left Arm,  Patient Position: Sitting)    Pulse 70    Temp 97.8 F (36.6 C) (Oral)    Resp 18    Ht 5' 1.81" (1.57 m)    Wt 117 lb 9.6 oz (53.3 kg)    SpO2 100%    BMI 21.64 kg/m  General: Alert, oriented, no acute distress. HEENT: Normocephalic, atraumatic, sclera anicteric. Chest: Unlabored breathing on room air. Extremities: Grossly normal range of motion.  Warm, well perfused.  No edema bilaterally. Skin: No rashes or lesions noted. GU: Normal appearing external genitalia without erythema, excoriation, or lesions.  Speculum exam reveals mildly atrophic vaginal mucosa with radiation changes noted.,  No bleeding or discharge.  Bimanual exam reveals no nodularity or masses, some scar tissue noted along the midline of the vagina.    Laboratory & Radiologic Studies: None new  Assessment & Plan: Paula Newman is a 71 y.o. 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 MMR normal. High-intermediate risk factors. EBRT/VBT completed on 12/17/20.  Presents today for evaluation of 2 limited episodes of vaginal spotting, none since.  Exam is overall very reassuring, no  findings concerning for recurrence.  Discussed precautions.  Patient has close follow-up with radiation oncology and will see me in approximately 4 months.  22 minutes of total time was spent for this patient encounter, including preparation, face-to-face counseling with the patient and coordination of care, and documentation of the encounter.  Jeral Pinch, MD  Division of Gynecologic Oncology  Department of Obstetrics and Gynecology  [facility]

## 2021-12-02 ENCOUNTER — Telehealth: Payer: Self-pay | Admitting: *Deleted

## 2021-12-02 NOTE — Telephone Encounter (Signed)
RETURNED PATIENT'S PHONE CALL, SPOKE WITH PATIENT. ?

## 2021-12-05 DIAGNOSIS — Z20828 Contact with and (suspected) exposure to other viral communicable diseases: Secondary | ICD-10-CM | POA: Diagnosis not present

## 2021-12-12 ENCOUNTER — Ambulatory Visit: Payer: Medicare Other | Admitting: Gynecologic Oncology

## 2021-12-16 DIAGNOSIS — L600 Ingrowing nail: Secondary | ICD-10-CM | POA: Diagnosis not present

## 2021-12-16 DIAGNOSIS — L03031 Cellulitis of right toe: Secondary | ICD-10-CM | POA: Diagnosis not present

## 2021-12-16 DIAGNOSIS — I70203 Unspecified atherosclerosis of native arteries of extremities, bilateral legs: Secondary | ICD-10-CM | POA: Diagnosis not present

## 2021-12-16 DIAGNOSIS — L02611 Cutaneous abscess of right foot: Secondary | ICD-10-CM | POA: Diagnosis not present

## 2021-12-25 ENCOUNTER — Ambulatory Visit: Payer: Self-pay | Admitting: Radiation Oncology

## 2021-12-26 ENCOUNTER — Telehealth: Payer: Self-pay | Admitting: *Deleted

## 2021-12-26 NOTE — Telephone Encounter (Signed)
CALLED PATIENT TO RESCHEDULE FU ON 01-01-22 DUE TO DR. KINARD BEING IN THE OR, SPOKE WITH PATENT AND SHE AGREED TO COME ON 01-19-22 @ 4 PM ?

## 2021-12-30 ENCOUNTER — Telehealth: Payer: Self-pay | Admitting: *Deleted

## 2021-12-30 NOTE — Telephone Encounter (Signed)
RETURNED PATIENT'S PHONE CALL, SPOKE WITH PATIENT. ?

## 2022-01-01 ENCOUNTER — Ambulatory Visit: Payer: Medicare Other | Admitting: Radiation Oncology

## 2022-01-18 NOTE — Progress Notes (Signed)
?Radiation Oncology         [phone] ?________________________________ ? ?Name: Paula Newman MRN: [id number]  ?Date: 01/19/2022  DOB: [birth year] ? ?Follow-Up Visit Note ? ?CC: [name], MD  Everitt Amber, MD ? ?No diagnosis found. ? ?Diagnosis:  Stage IB, grade 2, endometrioid endometrial cancer with high risk features (deep myometrial invasion, LVSI, and isolated tumor cells) ? ?Interval Since Last Radiation: 1 year, 1 month, and 2 days  ? ?Radiation Treatment Dates: 10/24/2020 through 12/17/2020 ?  ?Site: Uterus ?Technique: IMRT ?Total Dose (Gy): 45/45 ?Dose per Fx (Gy): 1.8 ?Completed Fx: 25/25 ?Beam Energies: 6X  ?  ?Site: Vagina, pelvis ?Technique: HDR-brachytherapy ?Total Dose (Gy): 18/18 ?Dose per Fx (Gy): 6 ?Completed Fx: 3/3 ?Beam Energies: Ir-192 ? ?Narrative:  The patient returns today for routine follow-up, she was last seen here for follow-up on 06/26/21. Since her last visit, the patient followed up with Dr. Berline Lopes on 09/29/21. During which time, the patient reported some baseline constipation (for which she is using MiraLax) but otherwise denied any symptoms concerning for disease recurrence, and was noted as NED on examination. The patient was encouraged to continue using her vaginal dilator regularly.    ? ?The patient returned to Dr. Berline Lopes on 12/01/21 due to several instances of vaginal spotting following intercourse. She denied any further bleeding following these instances or pain. Physical exam again showed no evidence of disease recurrence. (Patient also reported using her vaginal dilator regularly).                           ? ?Of note: the patient also met with Dr. Iran Planas on 09/18/21 for follow-up on recurrent capsular contracture of subpectoral implants. Physical exam showed contracture as stable since her last visit, and the patient denied any interval changes / concerns.  ? ?She denies any pelvic pain.  She does occasionally have some bloating related to mild constipation.  She  denies any further problems with vaginal bleeding.  She denies any postcoital bleeding or pain.  She denies any hematuria or blood in her stools. ? ? ?Allergies:  is allergic to coconut oil and codeine. ? ?Meds: ?Current Outpatient Medications  ?Medication Sig Dispense Refill  ? ezetimibe (ZETIA) 10 MG tablet Take 10 mg by mouth daily.    ? promethazine-dextromethorphan (PROMETHAZINE-DM) 6.25-15 MG/5ML syrup Take 5 mLs by mouth every 6 (six) hours.    ? simvastatin (ZOCOR) 20 MG tablet Take 10 mg by mouth daily at 6 PM.    ? ?No current facility-administered medications for this encounter.  ? ? ?Physical Findings: ?The patient is in no acute distress. Patient is alert and oriented. ? vitals were not taken for this visit. .  No significant changes. Lungs are clear to auscultation bilaterally. Heart has regular rate and rhythm. No palpable cervical, supraclavicular, or axillary adenopathy. Abdomen soft, non-tender, normal bowel sounds. ? ?On pelvic examination the external genitalia were unremarkable. A speculum exam was performed. There are no mucosal lesions noted in the vaginal vault. the  On bimanual and rectovaginal examination there were no pelvic masses appreciated, Rectal sphincter tone good ? ? ? ?Lab Findings: ?Lab Results  ?Component Value Date  ? WBC 6.0 09/03/2020  ? HGB 12.8 09/03/2020  ? HCT 38.7 09/03/2020  ? MCV 89.4 09/03/2020  ? PLT 283 09/03/2020  ? ? ?Radiographic Findings: ?No results found. ? ?Impression:  Stage IB, grade 2, endometrioid endometrial cancer with high risk features (deep myometrial  invasion, LVSI, and isolated tumor cells) ? ?No evidence of recurrence on clinical exam today.  She does not appear to be exhibiting any aftereffects of her surgery, external beam radiation therapy and intracavitary brachytherapy treatments. ? ?Plan: She will follow-up with Dr. Berline Lopes in June.  Routine follow-up in radiation oncology in September. ? ?25 minutes of total time was spent for this patient  encounter, including preparation, face-to-face counseling with the patient and coordination of care, physical exam, and documentation of the encounter. ?____________________________________ ? ?[name], PhD, MD ? ? ?This document serves as a record of services personally performed by [name], MD. It was created on his behalf by Roney Mans, a trained medical scribe. The creation of this record is based on the scribe's personal observations and the provider's statements to them. This document has been checked and approved by the attending provider. ? ?

## 2022-01-19 ENCOUNTER — Encounter: Payer: Self-pay | Admitting: Radiation Oncology

## 2022-01-19 ENCOUNTER — Ambulatory Visit
Admission: RE | Admit: 2022-01-19 | Discharge: 2022-01-19 | Disposition: A | Discharge status: Home / Self Care | Payer: Medicare Other | Source: Ambulatory Visit | Attending: Radiation Oncology | Admitting: Radiation Oncology

## 2022-01-19 ENCOUNTER — Ambulatory Visit: Payer: Self-pay | Admitting: Radiation Oncology

## 2022-01-19 DIAGNOSIS — C541 Malignant neoplasm of endometrium: Secondary | ICD-10-CM

## 2022-01-19 DIAGNOSIS — Z923 Personal history of irradiation: Secondary | ICD-10-CM | POA: Diagnosis not present

## 2022-01-19 DIAGNOSIS — Z8542 Personal history of malignant neoplasm of other parts of uterus: Secondary | ICD-10-CM | POA: Diagnosis not present

## 2022-01-19 DIAGNOSIS — Z79899 Other long term (current) drug therapy: Secondary | ICD-10-CM | POA: Diagnosis not present

## 2022-01-19 DIAGNOSIS — K5900 Constipation, unspecified: Secondary | ICD-10-CM | POA: Diagnosis not present

## 2022-01-19 NOTE — Progress Notes (Signed)
Paula Newman is here today for follow up post radiation to the pelvic. ? ?They completed their radiation on: 12/17/20 ? ?Does the patient complain of any of the following: ? ?Pain:Patient denies pain.  ?Abdominal bloating: no ?Diarrhea/Constipation: Patient reports having normal bowel movements. ?Nausea/Vomiting: No ?Vaginal Discharge: No ?Blood in Urine or Stool: No ?Urinary Issues (dysuria/incomplete emptying/ incontinence/ increased frequency/urgency): No ?Does patient report using vaginal dilator 2-3 times a week and/or sexually active 2-3 weeks: Yes, patient reports both use of dilator and being sexually active.  ?Post radiation skin changes: No ? ? ?Additional comments if applicable: ?  ?Vitals:  ? 01/19/22 1526  ?BP: 115/71  ?Pulse: 73  ?Resp: 18  ?Temp: (!) 97.5 ?F (36.4 ?C)  ?TempSrc: Temporal  ?SpO2: 96%  ?Weight: 121 lb 2 oz (54.9 kg)  ?Height: '5\' 4"'$  (1.626 m)  ?  ?

## 2022-01-22 DIAGNOSIS — Z20828 Contact with and (suspected) exposure to other viral communicable diseases: Secondary | ICD-10-CM | POA: Diagnosis not present

## 2022-02-11 DIAGNOSIS — E785 Hyperlipidemia, unspecified: Secondary | ICD-10-CM | POA: Diagnosis not present

## 2022-02-11 DIAGNOSIS — Z8542 Personal history of malignant neoplasm of other parts of uterus: Secondary | ICD-10-CM | POA: Diagnosis not present

## 2022-02-11 DIAGNOSIS — Z1331 Encounter for screening for depression: Secondary | ICD-10-CM | POA: Diagnosis not present

## 2022-02-11 DIAGNOSIS — M810 Age-related osteoporosis without current pathological fracture: Secondary | ICD-10-CM | POA: Diagnosis not present

## 2022-02-11 DIAGNOSIS — Z0000 Encounter for general adult medical examination without abnormal findings: Secondary | ICD-10-CM | POA: Diagnosis not present

## 2022-02-17 DIAGNOSIS — H00012 Hordeolum externum right lower eyelid: Secondary | ICD-10-CM | POA: Diagnosis not present

## 2022-02-21 DIAGNOSIS — Z20828 Contact with and (suspected) exposure to other viral communicable diseases: Secondary | ICD-10-CM | POA: Diagnosis not present

## 2022-03-17 ENCOUNTER — Telehealth: Payer: Self-pay | Admitting: *Deleted

## 2022-03-17 NOTE — Telephone Encounter (Signed)
Called and moved the patient's appt to an earlier time

## 2022-03-18 IMAGING — MG MM DIGITAL DIAGNOSTIC UNILAT*L* IMPLANT W/ TOMO W/ CAD
6 series · 6 of 14 positions shown · non-contrast
Comparison: Previous exam(s).

ACR Breast Density Category a: The breast tissue is almost entirely
fatty.

CLINICAL DATA: Possible left breast mass seen on CT imaging. The
patient has a history of breast implants. She did have a set of
implants removed many years ago due to rupture.

EXAM:
DIGITAL DIAGNOSTIC LEFT MAMMOGRAM WITH IMPLANTS AND TOMO
ULTRASOUND LEFT BREAST
The patient has retropectoral implants. Standard and implant
displaced views were performed.

[L CC]
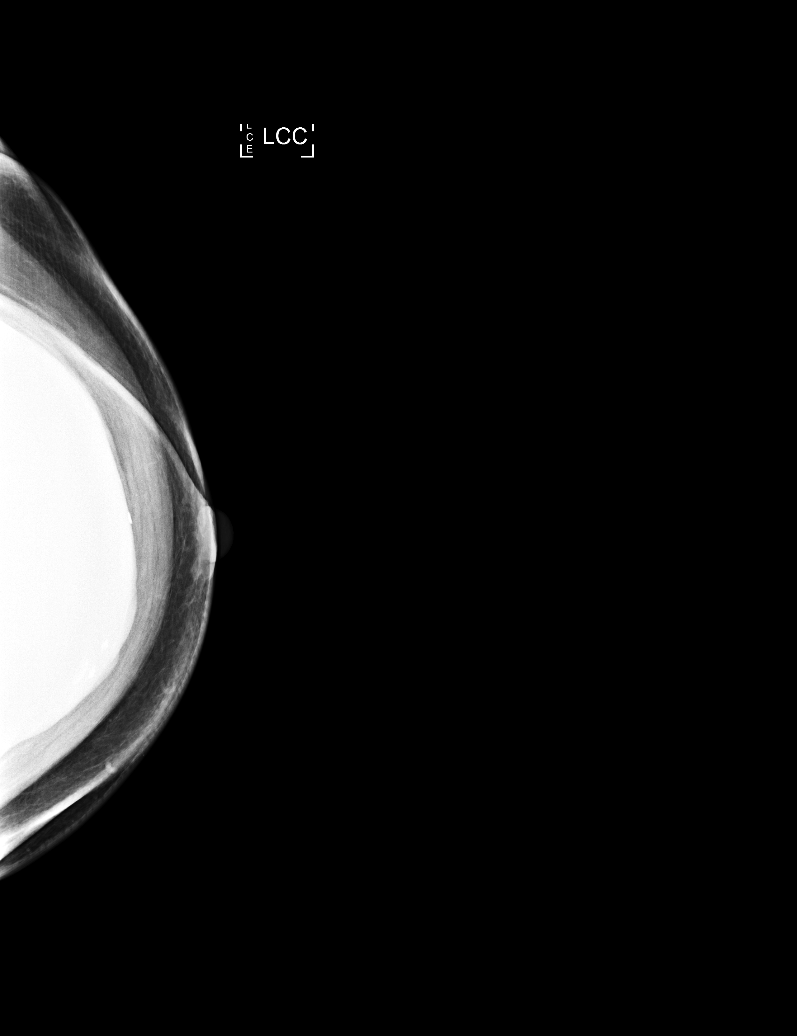

[L MLO]
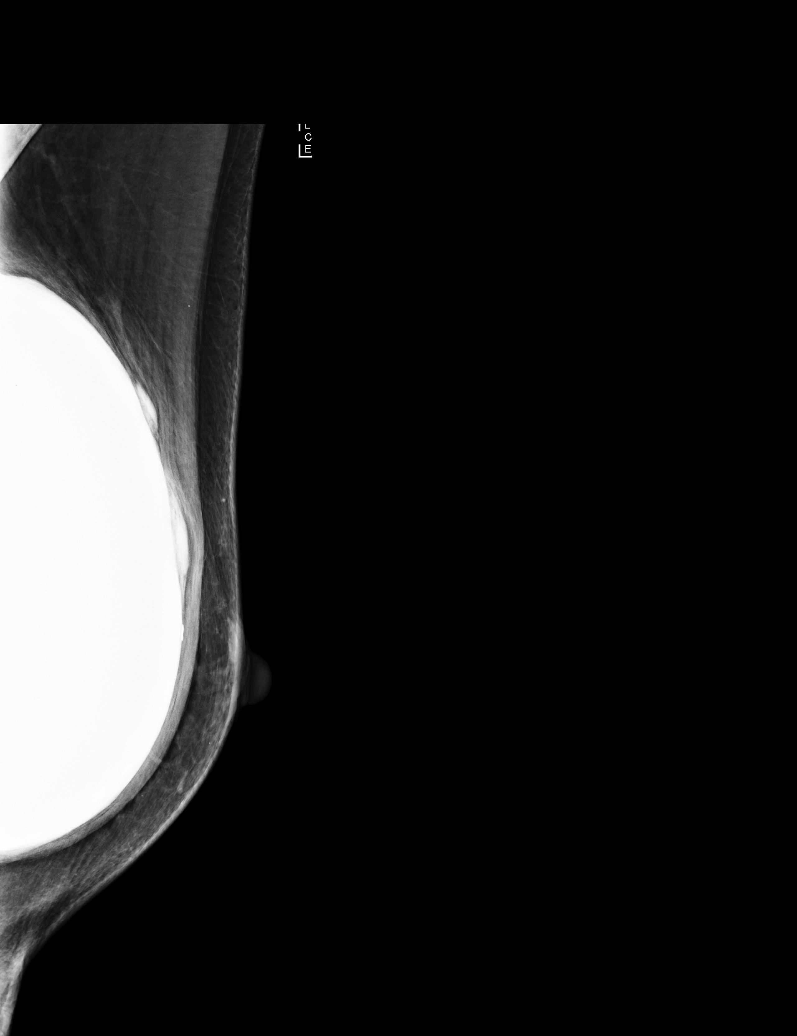

[L CC synth-2D]
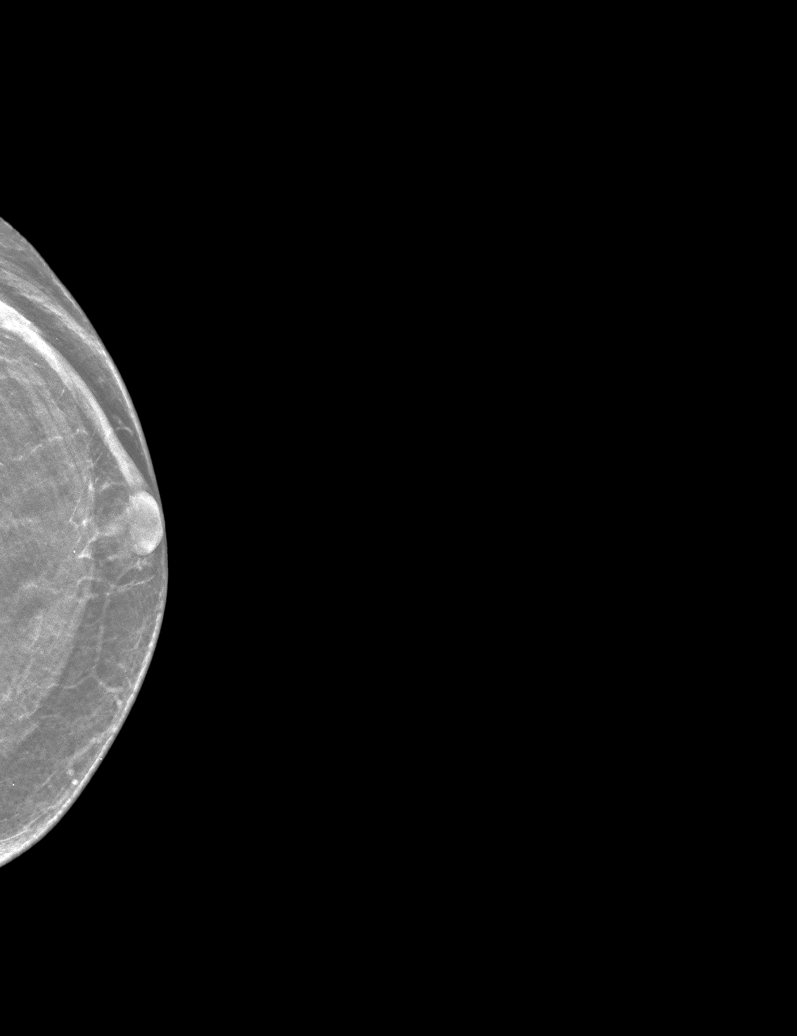

[L MLO synth-2D]
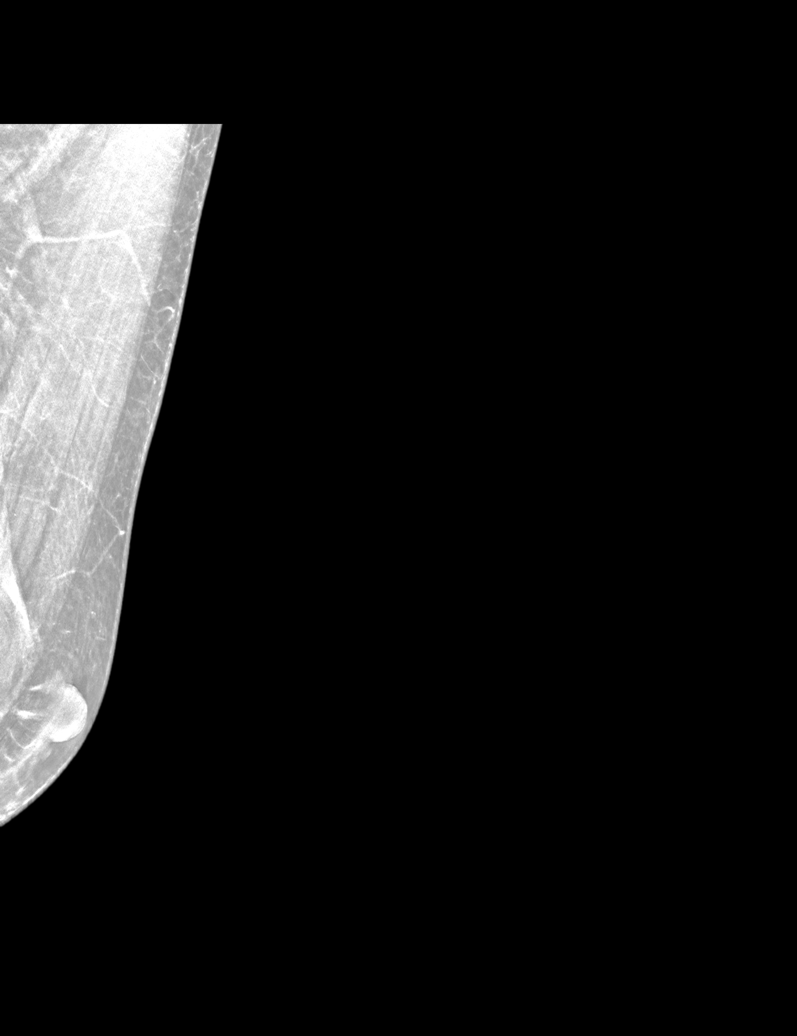

[L MLOID BREAST TOMOSYNTHESIS IMAGE tomo · tomo slice 18/35.0]
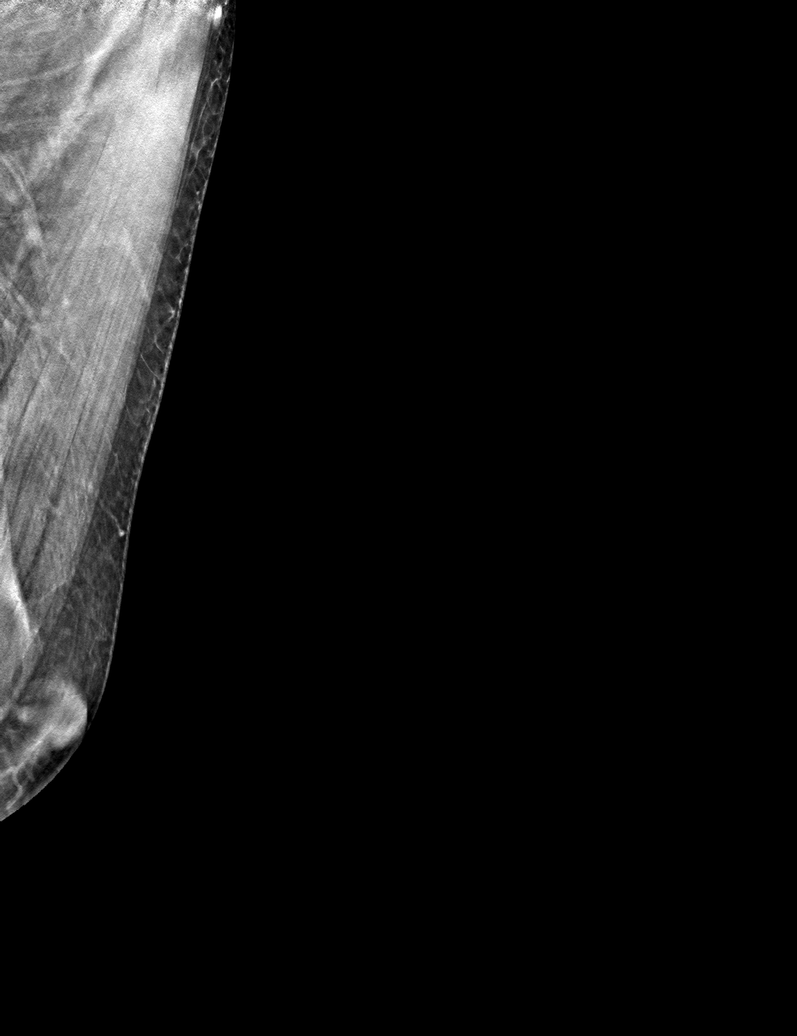

[L CCID BREAST TOMOSYNTHESIS IMAGE tomo · tomo slice 15/28.0]
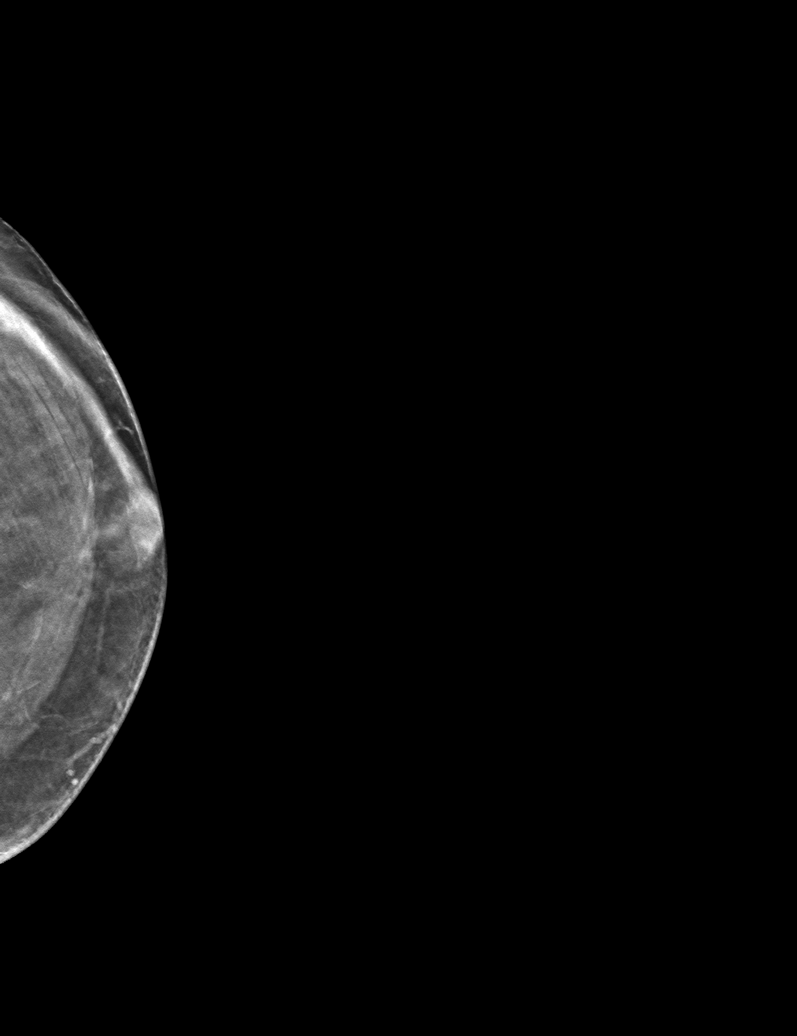

[6 of 14 positions shown; findings below may reference images not displayed]

FINDINGS: Review of the CT imaging suggests the possible masses adjacent to
the left implant may be due to extracapsular silicone. There is also
suggestion of a possible focus of extracapsular silicone on the
right. There is suggestion of a possible right implant rupture on CT
imaging although the findings are not specific.

High attenuation material along the periphery of the patient's
implant is consistent with extracapsular silicone which could be due
to current rupture or the reported previous fracture. No other
suspicious mammographic findings on the left.

On physical exam, no suspicious lumps are identified.

Targeted ultrasound is performed, showing several hypoechoic masses
along the lateral and inferior aspects of the left breast implant.
6 o'clock, 7 cm from the nipple. A smaller adjacent region measures
8 mm in greatest dimension at [DATE], 7 cm from the nipple. Other
similar regions are seen as well. While not classic, there is
suggestion of snowstorm appearance in the region of these
nodules/masses. There is also a stepladder appearance associated
with the left implant suggesting current rupture.
IMPRESSION: The nodule/masses seen on CT imaging and today's ultrasound are all
favored to represent sequela of implant rupture with extracapsular
silicone. There is suggestion of a current rupture on the left on
today's ultrasound and a possible rupture on the right based on CT
imaging.

RECOMMENDATION:
Recommend breast MRI for further evaluation of the implants and the
Peri-implant nodules/masses. If the patient cannot have MRI, which
is highly suggested, recommend six-month follow-up ultrasound.

I have discussed the findings and recommendations with the patient.
If applicable, a reminder letter will be sent to the patient
regarding the next appointment.

BI-RADS CATEGORY  3: Probably benign.

## 2022-03-18 IMAGING — US US BREAST*L* LIMITED INC AXILLA
1 series · 12 of 12 positions shown · non-contrast
Comparison: Previous exam(s).

ACR Breast Density Category a: The breast tissue is almost entirely
fatty.

CLINICAL DATA: Possible left breast mass seen on CT imaging. The
patient has a history of breast implants. She did have a set of
implants removed many years ago due to rupture.

EXAM:
DIGITAL DIAGNOSTIC LEFT MAMMOGRAM WITH IMPLANTS AND TOMO
ULTRASOUND LEFT BREAST
The patient has retropectoral implants. Standard and implant
displaced views were performed.

[Series 1: us breast*left* limited inc axilla · 0.04mm/px · 12 of 12 slices shown]
[im 1/12]
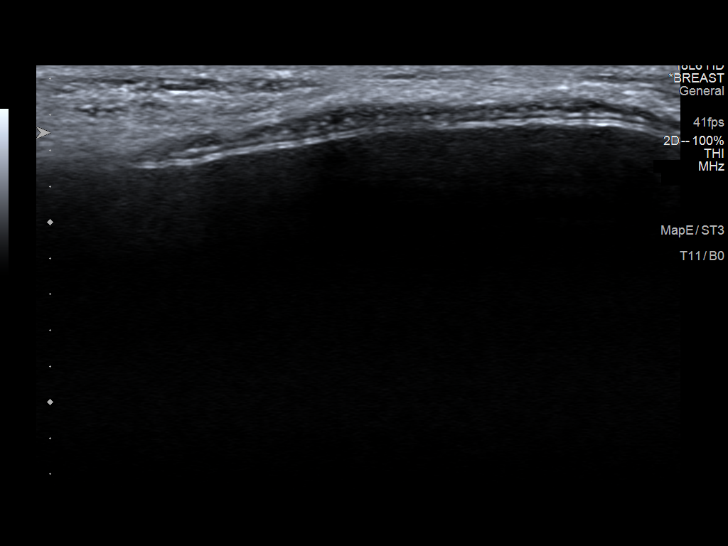
[im 2/12]
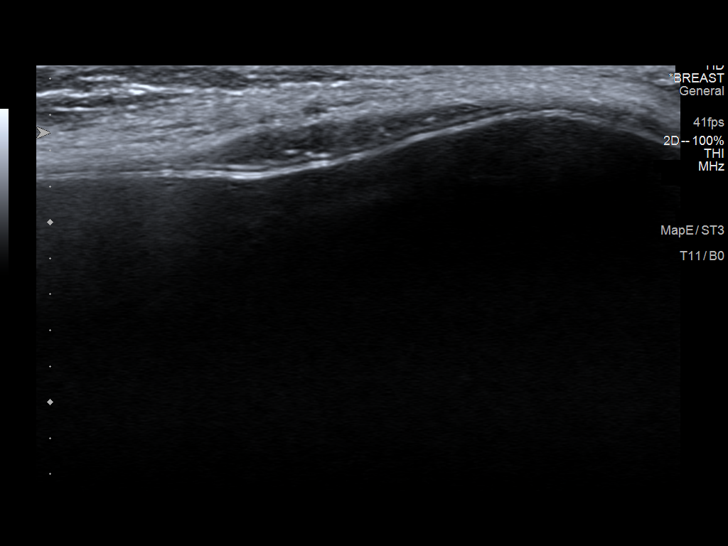
[im 3/12]
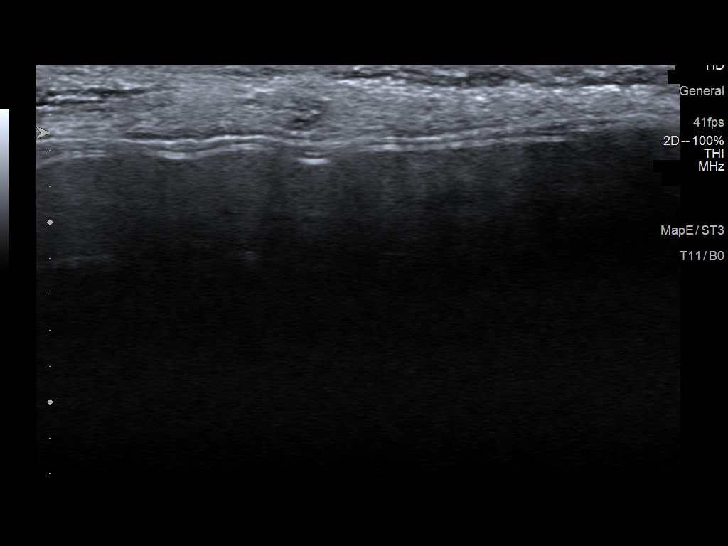
[im 4/12]
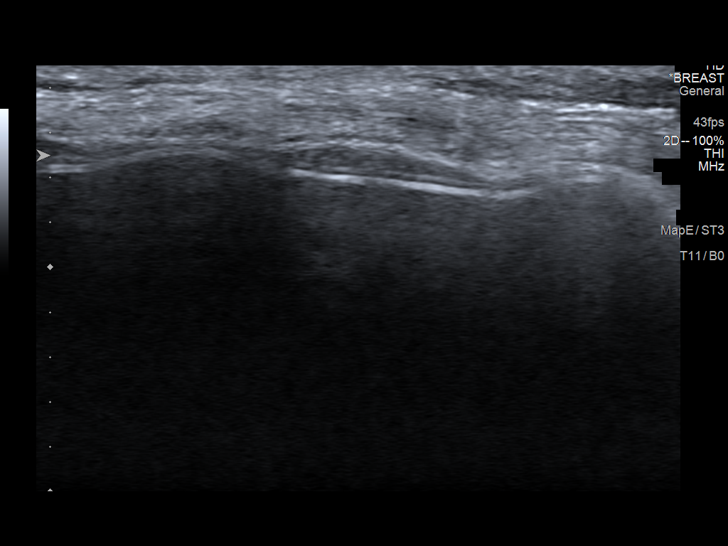
[im 5/12]
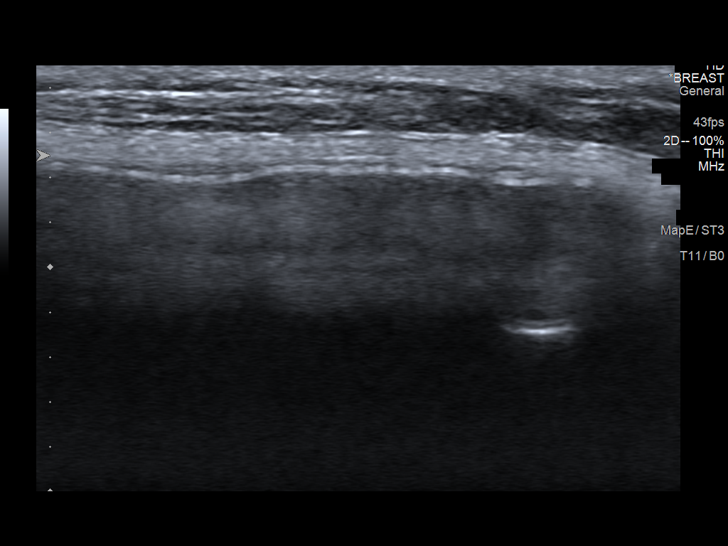
[im 6/12]
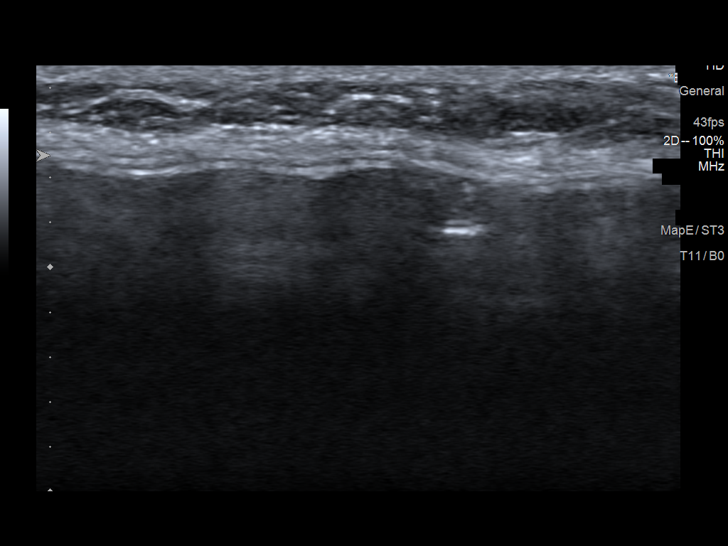
[im 7/12]
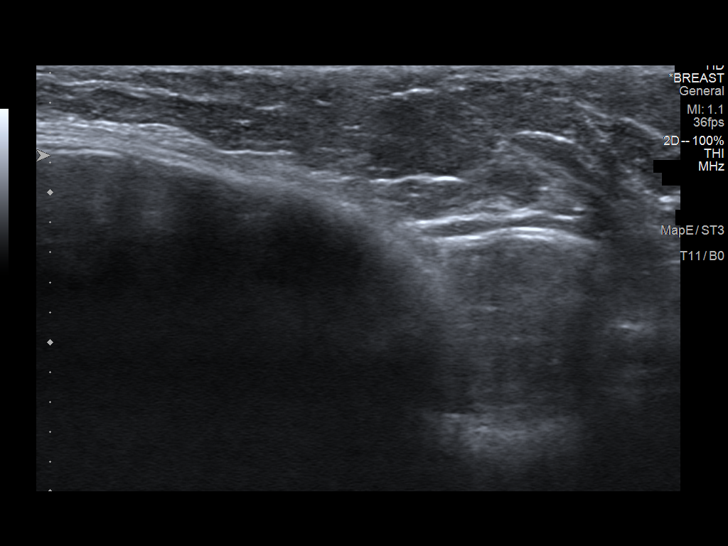
[im 8/12]
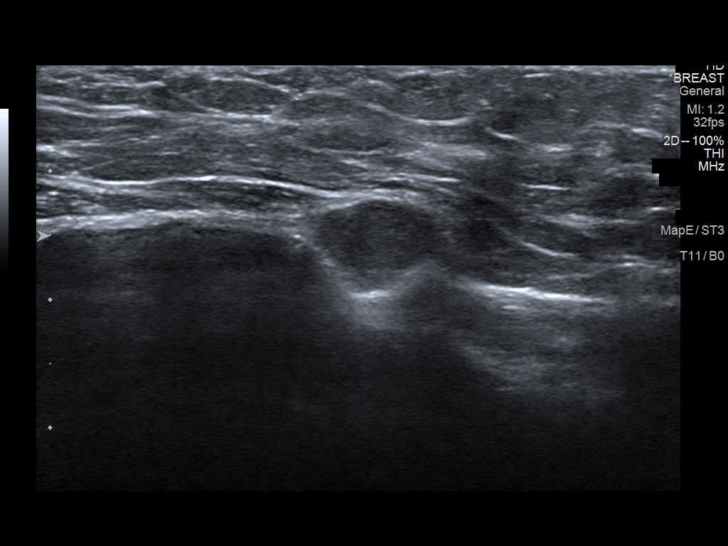
[im 9/12]
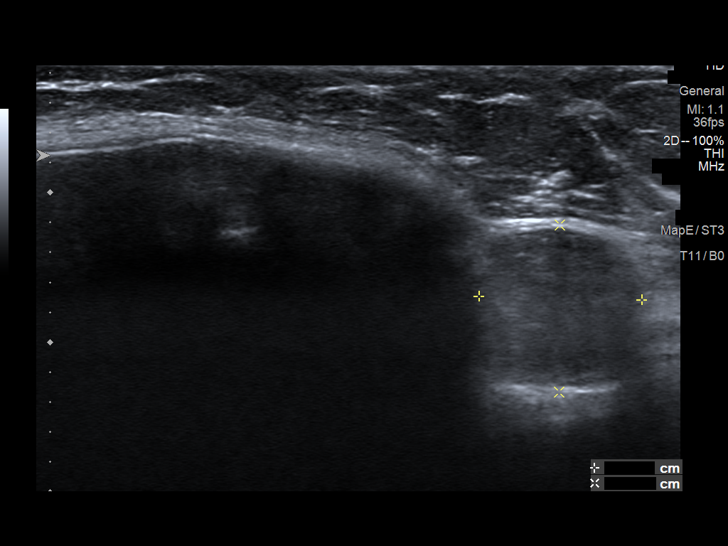
[im 10/12]
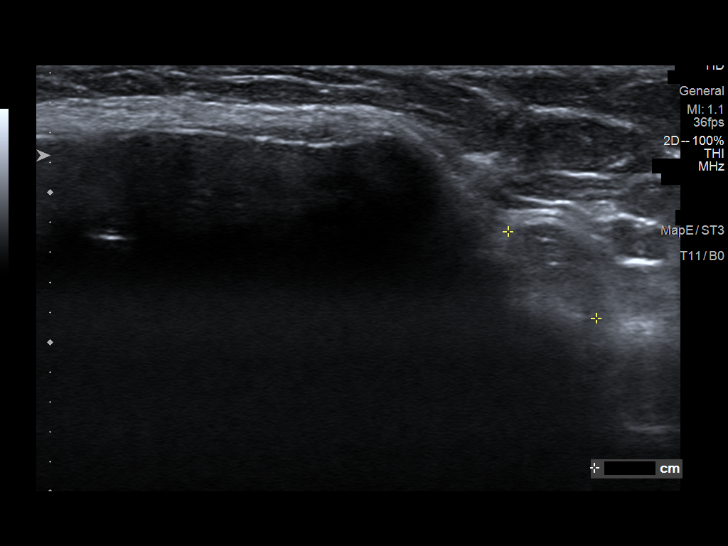
[im 11/12]
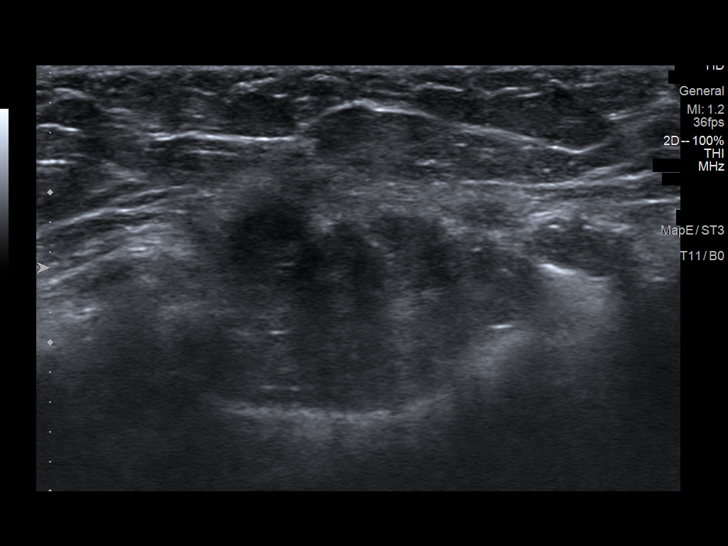
[im 12/12]
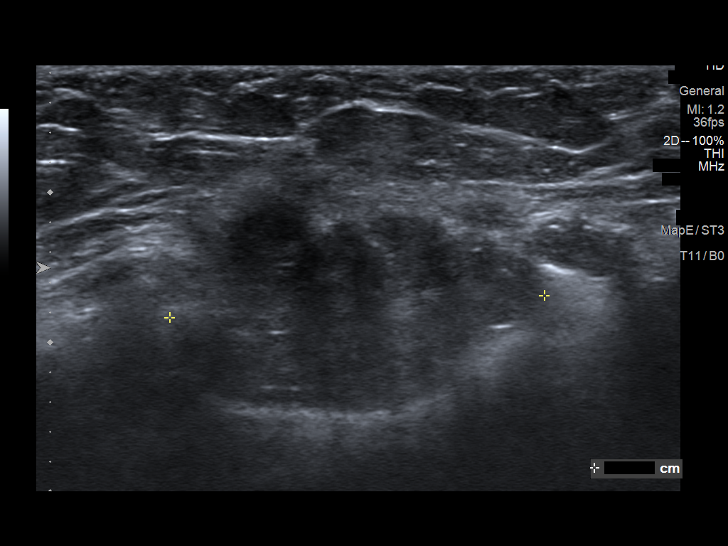

[12 of 12 positions shown; findings below may reference images not displayed]

FINDINGS: Review of the CT imaging suggests the possible masses adjacent to
the left implant may be due to extracapsular silicone. There is also
suggestion of a possible focus of extracapsular silicone on the
right. There is suggestion of a possible right implant rupture on CT
imaging although the findings are not specific.

High attenuation material along the periphery of the patient's
implant is consistent with extracapsular silicone which could be due
to current rupture or the reported previous fracture. No other
suspicious mammographic findings on the left.

On physical exam, no suspicious lumps are identified.

Targeted ultrasound is performed, showing several hypoechoic masses
along the lateral and inferior aspects of the left breast implant.
6 o'clock, 7 cm from the nipple. A smaller adjacent region measures
8 mm in greatest dimension at [DATE], 7 cm from the nipple. Other
similar regions are seen as well. While not classic, there is
suggestion of snowstorm appearance in the region of these
nodules/masses. There is also a stepladder appearance associated
with the left implant suggesting current rupture.
IMPRESSION: The nodule/masses seen on CT imaging and today's ultrasound are all
favored to represent sequela of implant rupture with extracapsular
silicone. There is suggestion of a current rupture on the left on
today's ultrasound and a possible rupture on the right based on CT
imaging.

RECOMMENDATION:
Recommend breast MRI for further evaluation of the implants and the
Peri-implant nodules/masses. If the patient cannot have MRI, which
is highly suggested, recommend six-month follow-up ultrasound.

I have discussed the findings and recommendations with the patient.
If applicable, a reminder letter will be sent to the patient
regarding the next appointment.

BI-RADS CATEGORY  3: Probably benign.

## 2022-03-31 ENCOUNTER — Other Ambulatory Visit: Payer: Self-pay | Admitting: Family Medicine

## 2022-03-31 ENCOUNTER — Encounter: Payer: Self-pay | Admitting: Gynecologic Oncology

## 2022-03-31 DIAGNOSIS — Z1231 Encounter for screening mammogram for malignant neoplasm of breast: Secondary | ICD-10-CM

## 2022-04-03 ENCOUNTER — Inpatient Hospital Stay: Payer: Medicare Other | Attending: Gynecologic Oncology | Admitting: Gynecologic Oncology

## 2022-04-03 ENCOUNTER — Encounter: Payer: Self-pay | Admitting: Gynecologic Oncology

## 2022-04-03 VITALS — BP 124/63 | HR 65 | Temp 97.7°F | Resp 16 | Ht 62.21 in | Wt 121.0 lb

## 2022-04-03 DIAGNOSIS — Z8542 Personal history of malignant neoplasm of other parts of uterus: Secondary | ICD-10-CM

## 2022-04-03 DIAGNOSIS — Z90722 Acquired absence of ovaries, bilateral: Secondary | ICD-10-CM | POA: Insufficient documentation

## 2022-04-03 DIAGNOSIS — C541 Malignant neoplasm of endometrium: Secondary | ICD-10-CM

## 2022-04-03 DIAGNOSIS — Z90710 Acquired absence of both cervix and uterus: Secondary | ICD-10-CM | POA: Diagnosis not present

## 2022-04-03 DIAGNOSIS — Z923 Personal history of irradiation: Secondary | ICD-10-CM | POA: Diagnosis not present

## 2022-04-03 NOTE — Patient Instructions (Signed)
It was good to see you today.  I do not see or feel any evidence of cancer recurrence on your exam.  I will see you back for follow-up in December.  My schedule is not out past September.  Please call back sometime in September or October to schedule this visit.    As always, if you develop new and concerning symptoms, such as vaginal bleeding or pelvic pain, please call to see me sooner.

## 2022-04-03 NOTE — Progress Notes (Signed)
Gynecologic Oncology Return Clinic Visit  04/03/2022  Reason for Visit: Surveillance in the setting of uterine cancer  Treatment History: Her symptoms began in October 2021 with vaginal bleeding which was postmenopausal.    She saw her primary care physician for a symptom related visit who referred her to see Dr. Ulanda [facility] (she did not have a gynecologist that she received GYN care from her PCP) on August 07, 2020.   Transvaginal US on July 30, 2020 showed a uterus measuring 7.7 x 3.9 x 5.3 cm with an endometrial thickness of 23 mm.   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 Pap testing was normal on 08/07/2020.   On 09/10/20 she underwent robotic assisted total hysterectomy, BSO, SLN biopsy. Intraoperative findings were significant for a 6cm normal appearing uterus with normal tubes and ovaries and no gross extrauterine disease. Surgery was uncomplicated.    Final pathology revealed a FIGO grade 2 endometrioid endometrial adenocarcinoma with deep myometrial invasion (34mm of 38mm thickness) with LVSI present. The adnexa and cervix were free of tumor. However, there were isolated tumor cells in both SLN's. MMR testing was in tact with MSS phenotype. This was staged as a stage IB grade 2 endometrial cancer with high/intermediate risk factors for recurrence and adjuvant radiation was recommended in accordance with NCCN guidelines.   She received adjuvant radiation (whole pelvic RT and vaginal brachytherapy) between 10/24/20 through 12/17/20. She received IMRT with 45 Gy to the pelvis in 25 fractions and 3 fractions x 6 Gy (total 18 Gy) to the vagina via brachytherapy.  Interval History: She last saw Dr. Sondra Come in April.  Reports doing very well.  Continues to use her vaginal dilator, has had no further spotting since last time I saw her.  Uses the smaller size initially and then halfway  through transitions to the medium size dilator.  Denies any pelvic pain.  Denies any vaginal discharge.  Reports baseline bowel function, which is a bowel movement every 2-3 days.  Continues to use Colace, will use MiraLAX maybe once every 2 to 3 weeks.  Past Medical/Surgical History: Past Medical History:  Diagnosis Date   Cancer [facility])    endometrial   Complication of anesthesia    slow to wake up   History of radiation therapy 12/03/2020-12/17/2020   vaginal brachytherapy      Dr [name]   History of radiation therapy 10/24/2020-11/27/2020   Pelvic IMRT   Dr [name]   Hyperlipidemia     Past Surgical History:  Procedure Laterality Date   AUGMENTATION MAMMAPLASTY Bilateral    BREAST EXCISIONAL BIOPSY Bilateral    BREAST IMPLANT REMOVAL Bilateral 01/07/2021   Procedure: REMOVAL BREAST IMPLANTS, CAPSULECTOMIES;  Surgeon: Irene Limbo, MD;  Location: [place];  Service: Plastics;  Laterality: Bilateral;   CATARACT EXTRACTION Right 09/23/2021   MENISCUS REPAIR Bilateral    PLACEMENT OF BREAST IMPLANTS Bilateral 01/07/2021   Procedure: PLACEMENT OF BREAST IMPLANTS;  Surgeon: Irene Limbo, MD;  Location: [facility];  Service: Plastics;  Laterality: Bilateral;   ROBOTIC ASSISTED TOTAL HYSTERECTOMY WITH BILATERAL SALPINGO OOPHERECTOMY Bilateral 09/10/2020   Procedure: XI ROBOTIC ASSISTED TOTAL HYSTERECTOMY WITH BILATERAL SALPINGO OOPHORECTOMY;  Surgeon: Everitt Amber, MD;  [facility];  Service: Gynecology;  Laterality: Bilateral;   SENTINEL NODE BIOPSY N/A 09/10/2020   Procedure: SENTINEL NODE BIOPSY;  Surgeon: Everitt Amber, MD;  [facility];  Service: Gynecology;  Laterality:  N/A;    Family History  Problem Relation Age of Onset   Hypertension Mother    Hypertension Father    Breast cancer Sister 4   Breast cancer Paternal Grandmother     Social History   Socioeconomic History   Marital status: Married    Spouse name: Not on  file   Number of children: 1   Years of education: Not on file   Highest education level: Not on file  Occupational History   Not on file  Tobacco Use   Smoking status: Never   Smokeless tobacco: Never  Vaping Use   Vaping Use: Never used  Substance and Sexual Activity   Alcohol use: Yes    Comment: rare occasion   2 glasses a year   Drug use: Never   Sexual activity: Yes    Birth control/protection: None  Other Topics Concern   Not on file  Social History Narrative   Not on file   Social Determinants of Health   Financial Resource Strain: Not on file  Food Insecurity: Not on file  Transportation Needs: Not on file  Physical Activity: Not on file  Stress: Not on file  Social Connections: Not on file    Current Medications:  Current Outpatient Medications:    docusate sodium (COLACE) 100 MG capsule, Take 100 mg by mouth 2 (two) times daily., Disp: , Rfl:    ezetimibe (ZETIA) 10 MG tablet, Take 10 mg by mouth daily., Disp: , Rfl:    simvastatin (ZOCOR) 20 MG tablet, Take 10 mg by mouth daily at 6 PM., Disp: , Rfl:   Review of Systems: Denies appetite changes, fevers, chills, fatigue, unexplained weight changes. Denies hearing loss, neck lumps or masses, mouth sores, ringing in ears or voice changes. Denies cough or wheezing.  Denies shortness of breath. Denies chest pain or palpitations. Denies leg swelling. Denies abdominal distention, pain, blood in stools, diarrhea, nausea, vomiting, or early satiety. Denies pain with intercourse, dysuria, frequency, hematuria or incontinence. Denies hot flashes, pelvic pain, vaginal bleeding or vaginal discharge.   Denies joint pain, back pain or muscle pain/cramps. Denies itching, rash, or wounds. Denies dizziness, headaches, numbness or seizures. Denies swollen lymph nodes or glands, denies easy bruising or bleeding. Denies anxiety, depression, confusion, or decreased concentration.  Physical Exam: BP 124/63 (BP Location:  Right Arm, Patient Position: Sitting)   Pulse 65   Temp 97.7 F (36.5 C) (Oral)   Resp 16   Ht 5' 2.21" (1.58 m)   Wt 121 lb (54.9 kg)   SpO2 100%   BMI 21.99 kg/m  General: Alert, oriented, no acute distress. HEENT: Normocephalic, atraumatic, sclera anicteric. Chest: Clear to auscultation bilaterally.  No wheezes or rhonchi. Cardiovascular: Regular rate and rhythm, no murmurs. Abdomen: soft, nontender.  Normoactive bowel sounds.  No masses or hepatosplenomegaly appreciated.  Well-healed incisions. Extremities: Grossly normal range of motion.  Warm, well perfused.  No edema bilaterally. Skin: No rashes or lesions noted. Lymphatics: No cervical, supraclavicular, or inguinal adenopathy. GU: Normal appearing external genitalia without erythema, excoriation, or lesions.  Speculum exam reveals mildly atrophic vaginal mucosa with radiation changes noted.,  No bleeding or discharge.  Bimanual exam reveals no nodularity or masses, some scar tissue noted along the midline of the vagina, unchanged.   Laboratory & Radiologic Studies: None new  Assessment & Plan: ALINDA EGOLF is a 71 y.o. 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 MMR normal. High-intermediate risk factors. EBRT/VBT completed on 12/17/20.   The patient is overall doing well today and is NED on exam.  Discussed adding dietary fiber or Metamucil to see if this helps with bowel function.  Per NCCN surveillance recommendations, we will continue with visits every 3 months alternating between my office and radiation oncology for 2 years and then transition to visits every 6 months if she remains NED.  We reviewed signs and symptoms that would be concerning for cancer recurrence, and I stressed the importance of calling if she develops any of these.  The patient will see Dr. Sondra Come in September and return to see me in  December.  22 minutes of total time was spent for this patient encounter, including preparation, face-to-face counseling with the patient and coordination of care, and documentation of the encounter.  Jeral Pinch, MD  Division of Gynecologic Oncology  Department of Obstetrics and Gynecology  [facility]

## 2022-04-07 DIAGNOSIS — H43393 Other vitreous opacities, bilateral: Secondary | ICD-10-CM | POA: Diagnosis not present

## 2022-04-28 DIAGNOSIS — R300 Dysuria: Secondary | ICD-10-CM | POA: Diagnosis not present

## 2022-04-29 DIAGNOSIS — Z961 Presence of intraocular lens: Secondary | ICD-10-CM | POA: Diagnosis not present

## 2022-04-29 DIAGNOSIS — H18413 Arcus senilis, bilateral: Secondary | ICD-10-CM | POA: Diagnosis not present

## 2022-04-29 DIAGNOSIS — I10 Essential (primary) hypertension: Secondary | ICD-10-CM | POA: Diagnosis not present

## 2022-04-29 DIAGNOSIS — H26491 Other secondary cataract, right eye: Secondary | ICD-10-CM | POA: Diagnosis not present

## 2022-05-01 ENCOUNTER — Ambulatory Visit
Admission: RE | Admit: 2022-05-01 | Discharge: 2022-05-01 | Disposition: A | Discharge status: Home / Self Care | Payer: Medicare Other | Source: Ambulatory Visit | Attending: Family Medicine | Admitting: Family Medicine

## 2022-05-01 DIAGNOSIS — Z1231 Encounter for screening mammogram for malignant neoplasm of breast: Secondary | ICD-10-CM

## 2022-05-19 ENCOUNTER — Telehealth: Payer: Self-pay | Admitting: *Deleted

## 2022-05-19 NOTE — Telephone Encounter (Signed)
Patient called and scheduled a follow up appt for December with Dr Berline Lopes

## 2022-06-02 DIAGNOSIS — Z961 Presence of intraocular lens: Secondary | ICD-10-CM | POA: Diagnosis not present

## 2022-06-03 DIAGNOSIS — H26492 Other secondary cataract, left eye: Secondary | ICD-10-CM | POA: Diagnosis not present

## 2022-06-03 DIAGNOSIS — Z961 Presence of intraocular lens: Secondary | ICD-10-CM | POA: Diagnosis not present

## 2022-06-16 ENCOUNTER — Telehealth: Payer: Self-pay | Admitting: *Deleted

## 2022-06-16 NOTE — Telephone Encounter (Signed)
CALLED PATIENT TO ALTER FU ON 06-25-22, DUE TO DR. KINARD BEING ON VACATION, RESCHEDULED FOR 07-06-22 @ 4 PM, SPOKE WITH PATIENT AND SHE AGREED TO NEW DAY AND TIME

## 2022-06-25 ENCOUNTER — Ambulatory Visit: Payer: Self-pay | Admitting: Radiation Oncology

## 2022-06-26 ENCOUNTER — Telehealth: Payer: Self-pay | Admitting: *Deleted

## 2022-06-26 NOTE — Telephone Encounter (Signed)
Patient called and confirmed her appt with Dr Berline Lopes on 12/8

## 2022-07-06 ENCOUNTER — Ambulatory Visit
Admission: RE | Admit: 2022-07-06 | Discharge: 2022-07-06 | Disposition: A | Discharge status: Home / Self Care | Payer: Medicare Other | Source: Ambulatory Visit | Attending: Radiation Oncology | Admitting: Radiation Oncology

## 2022-07-06 ENCOUNTER — Other Ambulatory Visit: Payer: Self-pay

## 2022-07-06 ENCOUNTER — Encounter: Payer: Self-pay | Admitting: Radiation Oncology

## 2022-07-06 DIAGNOSIS — C541 Malignant neoplasm of endometrium: Secondary | ICD-10-CM | POA: Diagnosis not present

## 2022-07-06 DIAGNOSIS — Z8542 Personal history of malignant neoplasm of other parts of uterus: Secondary | ICD-10-CM | POA: Diagnosis not present

## 2022-07-06 DIAGNOSIS — Z79899 Other long term (current) drug therapy: Secondary | ICD-10-CM | POA: Insufficient documentation

## 2022-07-06 DIAGNOSIS — Z923 Personal history of irradiation: Secondary | ICD-10-CM | POA: Diagnosis not present

## 2022-07-06 NOTE — Progress Notes (Signed)
Radiation Oncology         [phone] ________________________________  Name: Paula Newman MRN: [id number]  Date: 07/06/2022  DOB: Mar 08, 1951  Follow-Up Visit Note  CC: [name], MD  Everitt Amber, MD    ICD-10-CM   1. Endometrial cancer (HCC)  C54.1       Diagnosis: Stage IB, grade 2, endometrioid endometrial cancer with high risk features (deep myometrial invasion, LVSI, and isolated tumor cells)  Interval Since Last Radiation: 1 year, 6 months, and 17 days   Radiation Treatment Dates: 10/24/2020 through 12/17/2020   Site: Uterus Technique: IMRT Total Dose (Gy): 45/45 Dose per Fx (Gy): 1.8 Completed Fx: 25/25 Beam Energies: 6X    Site: Vagina, pelvis Technique: HDR-brachytherapy Total Dose (Gy): 18/18 Dose per Fx (Gy): 6 Completed Fx: 3/3 Beam Energies: Ir-192  Narrative:  The patient returns today for routine follow-up. She was last seen here for follow-up on 01/19/22. Since her last visit, the patient followed up with Dr. Berline Lopes on 04/03/22. During which time, the patient denied any symptoms concerning for disease recurrence and was noted as NED on examination. Patient did endorse having bowel movements every 2-3 days, for which Dr. Berline Lopes recommended using dietary fiber or Metamucil. The patient also reported using her vaginal dilator regularly and denied any spotting associated with use.          Pertinent imaging performed in the interval includes a bilateral screening mammogram on 05/01/22 which showed no evidence of malignancy in either breast.           She reports using her vaginal dilator as recommended.  She denies any bleeding with its use.  She denies any abdominal bloating pelvic pain vaginal bleeding or discharge.  She denies any hematuria or rectal bleeding.  She reports some occasional bloating related to certain foods.            Allergies:  is allergic to coconut (cocos nucifera), coconut oil, and codeine.  Meds: Current Outpatient Medications   Medication Sig Dispense Refill   docusate sodium (COLACE) 100 MG capsule Take 100 mg by mouth 2 (two) times daily.     ezetimibe (ZETIA) 10 MG tablet Take 10 mg by mouth daily.     simvastatin (ZOCOR) 20 MG tablet Take 10 mg by mouth daily at 6 PM.     No current facility-administered medications for this encounter.    Physical Findings: The patient is in no acute distress. Patient is alert and oriented.  height is 5' 2.21" (1.58 m) and weight is 121 lb 6.4 oz (55.1 kg). Her temporal temperature is 97.9 F (36.6 C). Her blood pressure is 125/89 and her pulse is 84. Her respiration is 15 and oxygen saturation is 99%. .  No significant changes. Lungs are clear to auscultation bilaterally. Heart has regular rate and rhythm. No palpable cervical, supraclavicular, or axillary adenopathy. Abdomen soft, non-tender, normal bowel sounds.  On pelvic examination the external genitalia were unremarkable. A speculum exam was performed. There are no mucosal lesions noted in the vaginal vault.  Some radiation changes noted at the vaginal cuff  On bimanual and rectovaginal examination there were no pelvic masses appreciated.  Vaginal cuff intact    Lab Findings: Lab Results  Component Value Date   WBC 6.0 09/03/2020   HGB 12.8 09/03/2020   HCT 38.7 09/03/2020   MCV 89.4 09/03/2020   PLT 283 09/03/2020    Radiographic Findings: No results found.  Impression:  Stage IB, grade 2, endometrioid  endometrial cancer with high risk features (deep myometrial invasion, LVSI, and isolated tumor cells)  No evidence of recurrence on clinical exam today.  She does not appear to be exhibiting any long-term effects from her pelvic radiation therapy.  Plan: She will follow-up with Dr. Berline Lopes in December.  Routine follow-up in radiation oncology in 6 months.   25 minutes of total time was spent for this patient encounter, including preparation, face-to-face counseling with the patient and coordination of care,  physical exam, and documentation of the encounter. ____________________________________  [name], PhD, MD  This document serves as a record of services personally performed by [name], MD. It was created on his behalf by Roney Mans, a trained medical scribe. The creation of this record is based on the scribe's personal observations and the provider's statements to them. This document has been checked and approved by the attending provider.

## 2022-07-06 NOTE — Progress Notes (Signed)
Paula Newman is here today for follow up post radiation to the pelvic.  They completed their radiation on: 12/17/20  Does the patient complain of any of the following:  Pain:No Abdominal bloating: Yes at times.  Diarrhea/Constipation: No Nausea/Vomiting: No Vaginal Discharge: No Blood in Urine or Stool: No Urinary Issues (dysuria/incomplete emptying/ incontinence/ increased frequency/urgency): No Does patient report using vaginal dilator 2-3 times a week and/or sexually active 2-3 weeks: Yes Post radiation skin changes: no   Additional comments if applicable:   BP 685/99 (BP Location: Left Arm, Patient Position: Sitting, Cuff Size: Normal)   Pulse 84   Temp 97.9 F (36.6 C) (Temporal)   Resp 15   Ht 5' 2.21" (1.58 m)   Wt 121 lb 6.4 oz (55.1 kg)   SpO2 99%   BMI 22.05 kg/m

## 2022-07-07 ENCOUNTER — Telehealth: Payer: Self-pay | Admitting: *Deleted

## 2022-07-07 NOTE — Telephone Encounter (Signed)
CALLED PATIENT TO ASK ABOUT COMING FOR 6 MONTH FU APPT. WITH DR. KINARD, PATIENT AGREED TO COME ON 12-07-22 @ 11 AM

## 2022-07-21 DIAGNOSIS — Z23 Encounter for immunization: Secondary | ICD-10-CM | POA: Diagnosis not present

## 2022-07-28 DIAGNOSIS — L814 Other melanin hyperpigmentation: Secondary | ICD-10-CM | POA: Diagnosis not present

## 2022-07-28 DIAGNOSIS — D225 Melanocytic nevi of trunk: Secondary | ICD-10-CM | POA: Diagnosis not present

## 2022-07-28 DIAGNOSIS — L821 Other seborrheic keratosis: Secondary | ICD-10-CM | POA: Diagnosis not present

## 2022-07-28 DIAGNOSIS — L578 Other skin changes due to chronic exposure to nonionizing radiation: Secondary | ICD-10-CM | POA: Diagnosis not present

## 2022-08-19 DIAGNOSIS — Z23 Encounter for immunization: Secondary | ICD-10-CM | POA: Diagnosis not present

## 2022-08-26 DIAGNOSIS — H00021 Hordeolum internum right upper eyelid: Secondary | ICD-10-CM | POA: Diagnosis not present

## 2022-09-21 DIAGNOSIS — Z9889 Other specified postprocedural states: Secondary | ICD-10-CM | POA: Diagnosis not present

## 2022-09-21 DIAGNOSIS — Z9013 Acquired absence of bilateral breasts and nipples: Secondary | ICD-10-CM | POA: Diagnosis not present

## 2022-09-25 ENCOUNTER — Encounter: Payer: Self-pay | Admitting: Gynecologic Oncology

## 2022-09-25 ENCOUNTER — Inpatient Hospital Stay: Payer: Medicare Other | Attending: Gynecologic Oncology | Admitting: Gynecologic Oncology

## 2022-09-25 ENCOUNTER — Other Ambulatory Visit: Payer: Self-pay

## 2022-09-25 VITALS — BP 100/56 | HR 72 | Temp 98.1°F | Resp 16 | Wt 123.9 lb

## 2022-09-25 DIAGNOSIS — Z90722 Acquired absence of ovaries, bilateral: Secondary | ICD-10-CM | POA: Insufficient documentation

## 2022-09-25 DIAGNOSIS — Z8542 Personal history of malignant neoplasm of other parts of uterus: Secondary | ICD-10-CM | POA: Diagnosis not present

## 2022-09-25 DIAGNOSIS — Z923 Personal history of irradiation: Secondary | ICD-10-CM | POA: Diagnosis not present

## 2022-09-25 DIAGNOSIS — Z90710 Acquired absence of both cervix and uterus: Secondary | ICD-10-CM | POA: Insufficient documentation

## 2022-09-25 DIAGNOSIS — Z08 Encounter for follow-up examination after completed treatment for malignant neoplasm: Secondary | ICD-10-CM | POA: Diagnosis not present

## 2022-09-25 DIAGNOSIS — C541 Malignant neoplasm of endometrium: Secondary | ICD-10-CM

## 2022-09-25 NOTE — Progress Notes (Signed)
Gynecologic Oncology Return Clinic Visit  09/25/22  Reason for Visit: Surveillance in the setting of uterine cancer   Treatment History: Her symptoms began in October 2021 with vaginal bleeding which was postmenopausal.    She saw her primary care physician for a symptom related visit who referred her to see Dr. Ulanda [facility] (she did not have a gynecologist that she received GYN care from her PCP) on August 07, 2020.   Transvaginal US on July 30, 2020 showed a uterus measuring 7.7 x 3.9 x 5.3 cm with an endometrial thickness of 23 mm.   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 Pap testing was normal on 08/07/2020.   On 09/10/20 she underwent robotic assisted total hysterectomy, BSO, SLN biopsy. Intraoperative findings were significant for a 6cm normal appearing uterus with normal tubes and ovaries and no gross extrauterine disease. Surgery was uncomplicated.    Final pathology revealed a FIGO grade 2 endometrioid endometrial adenocarcinoma with deep myometrial invasion (2m of 155mthickness) with LVSI present. The adnexa and cervix were free of tumor. However, there were isolated tumor cells in both SLN's. MMR testing was in tact with MSS phenotype. This was staged as a stage IB grade 2 endometrial cancer with high/intermediate risk factors for recurrence and adjuvant radiation was recommended in accordance with NCCN guidelines.   She received adjuvant radiation (whole pelvic RT and vaginal brachytherapy) between 10/24/20 through 12/17/20. She received IMRT with 45 Gy to the pelvis in 25 fractions and 3 fractions x 6 Gy (total 18 Gy) to the vagina via brachytherapy.  Interval History: Doing well.  Denies any abdominal or pelvic pain.  Endorses normal bowel function with prn medication.  Denies any urinary symptoms.  Denies any bleeding or discharge.  Past Medical/Surgical History: Past  Medical History:  Diagnosis Date   [facility]   [facility]   Complication of anesthesia    slow to wake up   History of radiation therapy 12/03/2020-12/17/2020   vaginal brachytherapy      Dr JaGery Pray History of radiation therapy 10/24/2020-11/27/2020   Pelvic IMRT   Dr JaGery Pray Hyperlipidemia     Past Surgical History:  Procedure Laterality Date   AUGMENTATION MAMMAPLASTY Bilateral    BREAST EXCISIONAL BIOPSY Bilateral    BREAST IMPLANT REMOVAL Bilateral 01/07/2021   Procedure: REMOVAL BREAST IMPLANTS, CAPSULECTOMIES;  Surgeon: ThIrene LimboMD;  [place] Service: Plastics;  Laterality: Bilateral;   CATARACT EXTRACTION Right 09/23/2021   MENISCUS REPAIR Bilateral    PLACEMENT OF BREAST IMPLANTS Bilateral 01/07/2021   Procedure: PLACEMENT OF BREAST IMPLANTS;  Surgeon: ThIrene LimboMD;  [place] Service: Plastics;  Laterality: Bilateral;   ROBOTIC ASSISTED TOTAL HYSTERECTOMY WITH BILATERAL SALPINGO OOPHERECTOMY Bilateral 09/10/2020   Procedure: XI ROBOTIC ASSISTED TOTAL HYSTERECTOMY WITH BILATERAL SALPINGO OOPHORECTOMY;  Surgeon: RoEveritt AmberMD;  [facility];  Service: Gynecology;  Laterality: Bilateral;   SENTINEL NODE BIOPSY N/A 09/10/2020   Procedure: SENTINEL NODE BIOPSY;  Surgeon: RoEveritt AmberMD;  [facility];  Service: Gynecology;  Laterality: N/A;    Family History  Problem Relation Age of Onset   Hypertension Mother    Hypertension Father    Breast cancer Sister [id number] Breast cancer Paternal Grandmother     Social History   Socioeconomic History   Marital status: Married    Spouse name: Not  on file   Number of children: 1   Years of education: Not on file   Highest education level: Not on file  Occupational History   Not on file  Tobacco Use   Smoking status: Never   Smokeless tobacco: Never  Vaping Use   Vaping Use: Never used  Substance and Sexual Activity   Alcohol  use: Yes    Comment: rare occasion   2 glasses a year   Drug use: Never   Sexual activity: Yes    Birth control/protection: None  Other Topics Concern   Not on file  Social History Narrative   Not on file   Social Determinants of Health   Financial Resource Strain: Not on file  Food Insecurity: Not on file  Transportation Needs: Not on file  Physical Activity: Not on file  Stress: Not on file  Social Connections: Not on file    Current Medications:  Current Outpatient Medications:    docusate sodium (COLACE) 100 MG capsule, Take 100 mg by mouth 2 (two) times daily., Disp: , Rfl:    ezetimibe (ZETIA) 10 MG tablet, Take 10 mg by mouth daily., Disp: , Rfl:    simvastatin (ZOCOR) 20 MG tablet, Take 10 mg by mouth daily at 6 PM., Disp: , Rfl:   Review of Systems: Denies appetite changes, fevers, chills, fatigue, unexplained weight changes. Denies hearing loss, neck lumps or masses, mouth sores, ringing in ears or voice changes. Denies cough or wheezing.  Denies shortness of breath. Denies chest pain or palpitations. Denies leg swelling. Denies abdominal distention, pain, blood in stools, constipation, diarrhea, nausea, vomiting, or early satiety. Denies pain with intercourse, dysuria, frequency, hematuria or incontinence. Denies hot flashes, pelvic pain, vaginal bleeding or vaginal discharge.   Denies joint pain, back pain or muscle pain/cramps. Denies itching, rash, or wounds. Denies dizziness, headaches, numbness or seizures. Denies swollen lymph nodes or glands, denies easy bruising or bleeding. Denies anxiety, depression, confusion, or decreased concentration.  Physical Exam: BP (!) 100/56 (BP Location: Right Arm, Patient Position: Sitting)   Pulse 72   Temp 98.1 F (36.7 C) (Oral)   Resp 16   Wt 123 lb 14.4 oz (56.2 kg)   SpO2 100%   BMI 22.51 kg/m  General: Alert, oriented, no acute distress. HEENT: Normocephalic, atraumatic, sclera anicteric. Chest: Clear to  auscultation bilaterally.  No wheezes or rhonchi. Cardiovascular: Regular rate and rhythm, no murmurs. Abdomen: soft, nontender.  Normoactive bowel sounds.  No masses or hepatosplenomegaly appreciated.  Well-healed incisions. Extremities: Grossly normal range of motion.  Warm, well perfused.  No edema bilaterally. Skin: No rashes or lesions noted. Lymphatics: No cervical, supraclavicular, or inguinal adenopathy. GU: Normal appearing external genitalia without erythema, excoriation, or lesions.  Speculum exam reveals mildly-moderately atrophic vaginal mucosa with radiation changes noted, especially along the anterior vagina at the apex.  Bimanual exam reveals no nodularity or masses, some scar tissue noted along the midline of the vagina, unchanged.   Laboratory & Radiologic Studies: None new  Assessment & Plan: Paula Newman is a 71 y.o. 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 MMR normal. High-intermediate risk factors. EBRT/VBT completed on 12/17/20.   Continues to do well and is NED on exam.  Bowel function has improved.   Per NCCN surveillance recommendations, we will continue with visits every 3 months alternating between my office and radiation oncology for 2 years and then  transition to visits every 6 months if she remains NED.  We reviewed signs and symptoms that would be concerning for cancer recurrence, and I stressed the importance of calling if she develops any of these.  The patient will see Dr. Sondra Come in September and return to see me in December.  20 minutes of total time was spent for this patient encounter, including preparation, face-to-face counseling with the patient and coordination of care, and documentation of the encounter.  Jeral Pinch, MD  Division of Gynecologic Oncology  Department of Obstetrics and Gynecology  [facility]

## 2022-09-25 NOTE — Patient Instructions (Signed)
It was good to see you today.  I do not see or feel any evidence of cancer recurrence on your exam.  I will see you for follow-up in 6 months.  As always, if you develop any new and concerning symptoms before your next visit, please call to see me sooner.   

## 2022-11-04 DIAGNOSIS — D485 Neoplasm of uncertain behavior of skin: Secondary | ICD-10-CM | POA: Diagnosis not present

## 2022-11-04 DIAGNOSIS — D220 Melanocytic nevi of lip: Secondary | ICD-10-CM | POA: Diagnosis not present

## 2022-11-04 DIAGNOSIS — L820 Inflamed seborrheic keratosis: Secondary | ICD-10-CM | POA: Diagnosis not present

## 2022-11-30 NOTE — Progress Notes (Signed)
Paula Newman is here today for follow up post radiation to the pelvic.  They completed their radiation on: 10/24/20- to 12/17/20   Does the patient complain of any of the following:  Pain: Patient reports some dysuria Abdominal bloating: no Diarrhea/Constipation: no Nausea/Vomiting: no Vaginal Discharge: no Blood in Urine or Stool: no Urinary Issues (dysuria/incomplete emptying/ incontinence/ increased frequency/urgency): States that she has some dysuria  Does patient report using vaginal dilator 2-3 times a week and/or sexually active 2-3 weeks: States that she is using the dilator 3 times a week Post radiation skin changes: no issues   Additional comments if applicable: Vitals:   [id number]  BP: 129/78  Pulse: 79  Resp: 18  Temp: (!) 96.8 F (36 C)  TempSrc: Temporal  SpO2: 100%  Weight: 55.8 kg  Height: 5' 2"$  (1.575 m)     Patient states that the dysuria started last night.

## 2022-12-06 NOTE — Progress Notes (Signed)
Radiation Oncology         [phone]) [phone] ________________________________  Name: Paula Newman MRN: [id number]  Date: 12/07/2022  DOB: 04-02-1951  Follow-Up Visit Note  CC: [name], MD  Everitt Amber, MD    ICD-10-CM   1. 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 She was last seen here for follow-up on 07/06/22. Since her last visit, the patient followed up with Dr. Berline Lopes on 09/25/22. During which time, the patient denied any symptoms concerning for disease recurrence and she was noted to be NED on examination.    No other significant interval history since the patient was last seen for follow-up.  On evaluation today the patient does report some dysuria which started in the past 24 to 48 hours.  She is concerned she may have a urinary tract infection.  She denies any hematuria.  She denies any abdominal bloating or pelvic pain.  She denies any vaginal bleeding.  She denies any  rectal bleeding.  She denies any problems with diarrhea.  She occasionally will have some constipation and uses Metamucil every other day for this issue.  She continues to use her vaginal dilator 3 times a week.  She denies any bleeding after dilator use.               Allergies:   is allergic to coconut (cocos nucifera), coconut oil, and codeine.  Meds: Current Outpatient Medications  Medication Sig Dispense Refill   docusate sodium (COLACE) 100 MG capsule Take 100 mg by mouth 2 (two) times daily.     ezetimibe (ZETIA) 10 MG tablet Take 10 mg by mouth daily.     phenazopyridine (PYRIDIUM) 200 MG tablet Take 1 tablet (200 mg total) by mouth 3 (three) times daily as needed for pain. 25 tablet 0   simvastatin (ZOCOR) 20 MG tablet Take 10 mg by mouth daily at 6 PM.     No current facility-administered medications for this encounter.    Physical Findings: The patient is in no acute distress. Patient is alert and oriented.  height is 5' 2"$  (1.575 m) and weight is 123 lb (55.8 kg). Her temporal temperature is 96.8 F (36 C) (abnormal). Her blood pressure is 129/78 and her pulse is 79. Her respiration is 18 and oxygen saturation is 100%. . Lungs are clear to auscultation bilaterally. Heart has regular rate and rhythm. No palpable cervical, supraclavicular, or axillary adenopathy. Abdomen soft, non-tender, normal bowel sounds.  On pelvic examination the external genitalia were unremarkable. A speculum exam was performed. There are no mucosal lesions noted in the vaginal vault.  Some radiation changes noted in the proximal vagina.  On bimanual and rectovaginal examination there were no pelvic masses appreciated.  Some scar tissue noted along the region of the  vaginal cuff.  No nodularity.  Vaginal cuff intact.  Rectal sphincter tone good.    Lab Findings: Lab Results  Component Value Date   WBC 6.0 09/03/2020   HGB 12.8 09/03/2020   HCT 38.7 09/03/2020   MCV 89.4 09/03/2020   PLT 283 09/03/2020    Radiographic Findings: No results found.  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  She does not appear to be  exhibiting any long-term effects from her surgery and radiation treatments to the pelvis as well as vaginal brachytherapy.  Dysuria as above.  Urinalysis culture and sensitivity was obtained today.  She is symptomatic with her dysuria and I have given her prescription for Pyridium to use until results of culture are back.  Plan: She will follow-up with Dr. Berline Lopes May 31.  Patient will then be over 2 years out from her radiation therapy and we will transition to every 6 months.  Her follow-up with me will be in early December.   25 minutes of total time was spent for this patient encounter, including preparation, face-to-face counseling with the patient and coordination of care, physical exam, and documentation of the encounter. ____________________________________  [name], PhD, MD  This document serves as a record of services personally performed by [name], MD. It was created on his behalf by Roney Mans, a trained medical scribe. The creation of this record is based on the scribe's personal observations and the provider's statements to them. This document has been checked and approved by the attending provider.

## 2022-12-07 ENCOUNTER — Encounter: Payer: Self-pay | Admitting: Radiation Oncology

## 2022-12-07 ENCOUNTER — Ambulatory Visit
Admission: RE | Admit: 2022-12-07 | Discharge: 2022-12-07 | Disposition: A | Discharge status: Home / Self Care | Payer: Medicare Other | Source: Ambulatory Visit | Attending: Radiation Oncology | Admitting: Radiation Oncology

## 2022-12-07 VITALS — BP 129/78 | HR 79 | Temp 96.8°F | Resp 18 | Ht 62.0 in | Wt 123.0 lb

## 2022-12-07 DIAGNOSIS — Z923 Personal history of irradiation: Secondary | ICD-10-CM | POA: Insufficient documentation

## 2022-12-07 DIAGNOSIS — Z8542 Personal history of malignant neoplasm of other parts of uterus: Secondary | ICD-10-CM | POA: Diagnosis not present

## 2022-12-07 DIAGNOSIS — Z79899 Other long term (current) drug therapy: Secondary | ICD-10-CM | POA: Diagnosis not present

## 2022-12-07 DIAGNOSIS — C541 Malignant neoplasm of endometrium: Secondary | ICD-10-CM

## 2022-12-07 DIAGNOSIS — R300 Dysuria: Secondary | ICD-10-CM | POA: Diagnosis not present

## 2022-12-07 LAB — URINALYSIS, ROUTINE W REFLEX MICROSCOPIC
Bilirubin Urine: NEGATIVE
Glucose, UA: NEGATIVE mg/dL
Ketones, ur: NEGATIVE mg/dL
Nitrite: NEGATIVE
Protein, ur: NEGATIVE mg/dL
Specific Gravity, Urine: 1.004 — ABNORMAL LOW (ref 1.005–1.030)
WBC, UA: >50 WBC/hpf (ref 0–5)
pH: 6 (ref 5.0–8.0)

## 2022-12-07 MED ORDER — PHENAZOPYRIDINE HCL 200 MG PO TABS: 200.0000 mg | ORAL_TABLET | Freq: Three times a day (TID) | ORAL | 0 refills | Status: DC | PRN

## 2022-12-09 ENCOUNTER — Telehealth: Payer: Self-pay | Admitting: *Deleted

## 2022-12-09 ENCOUNTER — Other Ambulatory Visit: Payer: Self-pay | Admitting: Radiation Oncology

## 2022-12-09 LAB — URINE CULTURE: Culture: >=100000 — AB

## 2022-12-09 MED ORDER — NITROFURANTOIN MONOHYD MACRO 100 MG PO CAPS: 100.0000 mg | ORAL_CAPSULE | Freq: Two times a day (BID) | ORAL | 0 refills | Status: DC

## 2022-12-09 NOTE — Telephone Encounter (Signed)
Spoke with the patient to let her know we received the results from her recent urinalysis and we have sent in a prescription to [facility], [place].  She verbalized understanding.  Paula Newman. Leonie Green, BSN

## 2022-12-30 DIAGNOSIS — L820 Inflamed seborrheic keratosis: Secondary | ICD-10-CM | POA: Diagnosis not present

## 2023-01-02 ENCOUNTER — Ambulatory Visit: Payer: Self-pay | Admitting: Radiation Oncology

## 2023-01-27 ENCOUNTER — Other Ambulatory Visit: Payer: Self-pay | Admitting: Family Medicine

## 2023-01-27 DIAGNOSIS — Z0000 Encounter for general adult medical examination without abnormal findings: Secondary | ICD-10-CM

## 2023-02-15 DIAGNOSIS — E785 Hyperlipidemia, unspecified: Secondary | ICD-10-CM | POA: Diagnosis not present

## 2023-02-16 DIAGNOSIS — I700 Atherosclerosis of aorta: Secondary | ICD-10-CM | POA: Diagnosis not present

## 2023-02-16 DIAGNOSIS — M810 Age-related osteoporosis without current pathological fracture: Secondary | ICD-10-CM | POA: Diagnosis not present

## 2023-02-16 DIAGNOSIS — Z0000 Encounter for general adult medical examination without abnormal findings: Secondary | ICD-10-CM | POA: Diagnosis not present

## 2023-02-16 DIAGNOSIS — E785 Hyperlipidemia, unspecified: Secondary | ICD-10-CM | POA: Diagnosis not present

## 2023-02-17 ENCOUNTER — Other Ambulatory Visit: Payer: Self-pay | Admitting: Family Medicine

## 2023-02-17 DIAGNOSIS — M810 Age-related osteoporosis without current pathological fracture: Secondary | ICD-10-CM

## 2023-02-25 ENCOUNTER — Telehealth: Payer: Self-pay

## 2023-02-25 NOTE — Telephone Encounter (Signed)
This sounds like most likely bleeding from a hemorrhoid. Please advise her to look at her anus with a mirror. If she sees a hemorrhoid, I would recommend using over the counter Preparation H.

## 2023-02-25 NOTE — Telephone Encounter (Addendum)
[name] called office this morning stating she saw Dr.Tucker in December. Dr. Pricilla Holm told her to call with any concerns, especially rectal bleeding.  She has recently seen blood when she had a BM, today only so far. When she first wiped it was bright red and a little in the toilet, on second wipe it was a little lighter pink. No rectal pressure/pain. No abdominal/pelvic pain. No change in diet. No nausea/vomiting. She has not noticed any symptoms leading up to this morning. She did mention she still takes Metamucil because she does not have "normal BM's" but she has not been constipated.   Pt was already scheduled to see Dr.Tucker on 5/31 but wanted to be seen earlier. Pt is scheduled for tomorrow 5/10@ 2:15. She was very thankful for getting her in so soon.  Dr.Tucker notified

## 2023-02-25 NOTE — Telephone Encounter (Signed)
I spoke to patient, she is aware of message from Dr. Pricilla Holm. She will check and call back either way. If she sees nothing we will keep her appointment for tomorrow. If she sees a hemorrhoid, appointment will be moved back on 5/31 @ 2:15.

## 2023-02-25 NOTE — Telephone Encounter (Signed)
Pt called office stating she checked area with a mirror, she does see a "dark bruised area" that looks like it was a small hemorrhoid that has busted.  She will keep an eye on the area and will still use Preparation H when needed.  She feels comfortable with moving her appointment back to 5/31 @ 2:15.  She wants to thank Dr.Tucker for the advise because she feels a lot better about things.  Dr.Tucker notified

## 2023-02-26 ENCOUNTER — Ambulatory Visit: Payer: Medicare Other | Admitting: Gynecologic Oncology

## 2023-03-09 DIAGNOSIS — L600 Ingrowing nail: Secondary | ICD-10-CM | POA: Diagnosis not present

## 2023-03-18 ENCOUNTER — Encounter: Payer: Self-pay | Admitting: Gynecologic Oncology

## 2023-03-19 ENCOUNTER — Inpatient Hospital Stay: Payer: Medicare Other | Admitting: Gynecologic Oncology

## 2023-03-19 ENCOUNTER — Inpatient Hospital Stay: Payer: Medicare Other | Attending: Gynecologic Oncology | Admitting: Gynecologic Oncology

## 2023-03-19 ENCOUNTER — Encounter: Payer: Self-pay | Admitting: Gynecologic Oncology

## 2023-03-19 VITALS — BP 128/78 | HR 61 | Temp 97.7°F | Resp 16 | Ht 62.01 in | Wt 122.8 lb

## 2023-03-19 DIAGNOSIS — Z90722 Acquired absence of ovaries, bilateral: Secondary | ICD-10-CM | POA: Insufficient documentation

## 2023-03-19 DIAGNOSIS — Z8542 Personal history of malignant neoplasm of other parts of uterus: Secondary | ICD-10-CM | POA: Diagnosis present

## 2023-03-19 DIAGNOSIS — Z90710 Acquired absence of both cervix and uterus: Secondary | ICD-10-CM | POA: Insufficient documentation

## 2023-03-19 DIAGNOSIS — Z803 Family history of malignant neoplasm of breast: Secondary | ICD-10-CM | POA: Insufficient documentation

## 2023-03-19 DIAGNOSIS — Z923 Personal history of irradiation: Secondary | ICD-10-CM | POA: Diagnosis not present

## 2023-03-19 DIAGNOSIS — C541 Malignant neoplasm of endometrium: Secondary | ICD-10-CM

## 2023-03-19 NOTE — Progress Notes (Signed)
Gynecologic Oncology Return Clinic Visit  03/19/23  Reason for Visit: Surveillance in the setting of uterine cancer    Treatment History: Her symptoms began in October 2021 with vaginal bleeding which was postmenopausal.    She saw her primary care physician for a symptom related visit who referred her to see Dr. Ambrose Mantle (she did not have a gynecologist that she received GYN care from her PCP) on August 07, 2020.   Transvaginal US on July 30, 2020 showed a uterus measuring 7.7 x 3.9 x 5.3 cm with an endometrial thickness of 23 mm.   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 Pap testing was normal on 08/07/2020.   On 09/10/20 she underwent robotic assisted total hysterectomy, BSO, SLN biopsy. Intraoperative findings were significant for a 6cm normal appearing uterus with normal tubes and ovaries and no gross extrauterine disease. Surgery was uncomplicated.    Final pathology revealed a FIGO grade 2 endometrioid endometrial adenocarcinoma with deep myometrial invasion (16mm of 17mm thickness) with LVSI present. The adnexa and cervix were free of tumor. However, there were isolated tumor cells in both SLN's. MMR testing was in tact with MSS phenotype. This was staged as a stage IB grade 2 endometrial cancer with high/intermediate risk factors for recurrence and adjuvant radiation was recommended in accordance with NCCN guidelines.   She received adjuvant radiation (whole pelvic RT and vaginal brachytherapy) between 10/24/20 through 12/17/20. She received IMRT with 45 Gy to the pelvis in 25 fractions and 3 fractions x 6 Gy (total 18 Gy) to the vagina via brachytherapy.  Interval History: Patient reports doing well.  She denies any vaginal bleeding or discharge.  Reports much improved bowel function with using Metamucil.  Denies any urinary symptoms.  Had 1 episode of bleeding per rectum,  resolved without any intervention.  Denies any abdominal or pelvic pain.  Past Medical/Surgical History: Past Medical History:  Diagnosis Date   Cancer [facility])    endometrial   Complication of anesthesia    slow to wake up   History of radiation therapy 12/03/2020-12/17/2020   vaginal brachytherapy      Dr Antony Blackbird   History of radiation therapy 10/24/2020-11/27/2020   Pelvic IMRT   Dr Antony Blackbird   Hyperlipidemia     Past Surgical History:  Procedure Laterality Date   AUGMENTATION MAMMAPLASTY Bilateral    BREAST EXCISIONAL BIOPSY Bilateral    BREAST IMPLANT REMOVAL Bilateral 01/07/2021   Procedure: REMOVAL BREAST IMPLANTS, CAPSULECTOMIES;  Surgeon: Glenna Fellows, MD;  Location: [facility];  Service: Plastics;  Laterality: Bilateral;   CATARACT EXTRACTION Right 09/23/2021   MENISCUS REPAIR Bilateral    PLACEMENT OF BREAST IMPLANTS Bilateral 01/07/2021   Procedure: PLACEMENT OF BREAST IMPLANTS;  Surgeon: Glenna Fellows, MD;  Location: [facility];  Service: Plastics;  Laterality: Bilateral;   ROBOTIC ASSISTED TOTAL HYSTERECTOMY WITH BILATERAL SALPINGO OOPHERECTOMY Bilateral 09/10/2020   Procedure: XI ROBOTIC ASSISTED TOTAL HYSTERECTOMY WITH BILATERAL SALPINGO OOPHORECTOMY;  Surgeon: Adolphus Birchwood, MD;  Location: [facility];  Service: Gynecology;  Laterality: Bilateral;   SENTINEL NODE BIOPSY N/A 09/10/2020   Procedure: SENTINEL NODE BIOPSY;  Surgeon: Adolphus Birchwood, MD;  Location: [facility];  Service: Gynecology;  Laterality: N/A;    Family History  Problem Relation Age of Onset   Hypertension Mother    Hypertension Father    Breast cancer Sister 56   Breast cancer Paternal Grandmother  Social History   Socioeconomic History   Marital status: Married    Spouse name: Not on file   Number of children: 1   Years of education: Not on file   Highest education level: Not on file  Occupational History   Not on file  Tobacco Use   Smoking status: Never    Smokeless tobacco: Never  Vaping Use   Vaping Use: Never used  Substance and Sexual Activity   Alcohol use: Yes    Comment: rare occasion   2 glasses a year   Drug use: Never   Sexual activity: Yes    Birth control/protection: None  Other Topics Concern   Not on file  Social History Narrative   Not on file   Social Determinants of Health   Financial Resource Strain: Not on file  Food Insecurity: Not on file  Transportation Needs: Not on file  Physical Activity: Not on file  Stress: Not on file  Social Connections: Not on file    Current Medications:  Current Outpatient Medications:    docusate sodium (COLACE) 100 MG capsule, Take 100 mg by mouth 2 (two) times daily., Disp: , Rfl:    ezetimibe (ZETIA) 10 MG tablet, Take 10 mg by mouth daily., Disp: , Rfl:    Psyllium (METAMUCIL PREMIUM BLEND) 52.63 % POWD, as directed Orally every other day, Disp: , Rfl:    simvastatin (ZOCOR) 20 MG tablet, Take 10 mg by mouth daily at 6 PM., Disp: , Rfl:   Review of Systems: Denies appetite changes, fevers, chills, fatigue, unexplained weight changes. Denies hearing loss, neck lumps or masses, mouth sores, ringing in ears or voice changes. Denies cough or wheezing.  Denies shortness of breath. Denies chest pain or palpitations. Denies leg swelling. Denies abdominal distention, pain, blood in stools, constipation, diarrhea, nausea, vomiting, or early satiety. Denies pain with intercourse, dysuria, frequency, hematuria or incontinence. Denies hot flashes, pelvic pain, vaginal bleeding or vaginal discharge.   Denies joint pain, back pain or muscle pain/cramps. Denies itching, rash, or wounds. Denies dizziness, headaches, numbness or seizures. Denies swollen lymph nodes or glands, denies easy bruising or bleeding. Denies anxiety, depression, confusion, or decreased concentration.  Physical Exam: BP 128/78   Pulse 61   Temp 97.7 F (36.5 C) (Oral)   Resp 16   Ht 5' 2.01" (1.575 m)    Wt 122 lb 12.8 oz (55.7 kg)   BMI 22.45 kg/m  General: Alert, oriented, no acute distress. HEENT: Normocephalic, atraumatic, sclera anicteric. Chest: Clear to auscultation bilaterally.  No wheezes or rhonchi. Cardiovascular: Regular rate and rhythm, no murmurs. Abdomen: soft, nontender.  Normoactive bowel sounds.  No masses or hepatosplenomegaly appreciated.  Well-healed incisions. Extremities: Grossly normal range of motion.  Warm, well perfused.  No edema bilaterally. Skin: No rashes or lesions noted. Lymphatics: No cervical, supraclavicular, or inguinal adenopathy. GU: Normal appearing external genitalia without erythema, excoriation, or lesions.  Speculum exam reveals mildly-moderately atrophic vaginal mucosa with radiation changes noted, especially along the anterior vagina at the apex.  Bimanual exam reveals no nodularity or masses, some scar tissue noted along the midline of the vagina, unchanged.  On rectovaginal exam, no masses or nodularity appreciated.  There is small hemorrhoid noted.  Laboratory & Radiologic Studies: None new  Assessment & Plan: Paula Newman is a 72 y.o. 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 MMR normal. High-intermediate  risk factors. EBRT/VBT completed on 12/17/20.   Continues to do well and is NED on exam.  Bowel function has improved significantly with use of Metamucil every other day.   Per NCCN surveillance recommendations, we are transitioning to visits every 6 months alternating between my office and radiation oncology.  We reviewed signs and symptoms that would be concerning for cancer recurrence, and I stressed the importance of calling if she develops any of these.  The patient will see Dr. Roselind Messier in December and return to see me in 1 year.  20 minutes of total time was spent for this patient encounter, including preparation,  face-to-face counseling with the patient and coordination of care, and documentation of the encounter.  Eugene Garnet, MD  Division of Gynecologic Oncology  Department of Obstetrics and Gynecology  [facility]

## 2023-03-19 NOTE — Patient Instructions (Signed)
It was good to see you today.  I do not see or feel any evidence of cancer recurrence on your exam.  I will see you for follow-up in 12 months.  Please call sometime after the new year to get a visit scheduled to see me this time next year.  As always, if you develop any new and concerning symptoms before your next visit, please call to see me sooner.  I do not see in your initial visit with Dr. Andrey Farmer nor in our records when your last colonoscopy was.  If this was outside of our system, we may not have access to this.

## 2023-04-01 ENCOUNTER — Ambulatory Visit: Payer: Medicare Other | Admitting: Gynecologic Oncology

## 2023-04-05 DIAGNOSIS — H43813 Vitreous degeneration, bilateral: Secondary | ICD-10-CM | POA: Diagnosis not present

## 2023-04-06 DIAGNOSIS — N3001 Acute cystitis with hematuria: Secondary | ICD-10-CM | POA: Diagnosis not present

## 2023-04-06 DIAGNOSIS — N390 Urinary tract infection, site not specified: Secondary | ICD-10-CM | POA: Diagnosis not present

## 2023-04-27 DIAGNOSIS — N3001 Acute cystitis with hematuria: Secondary | ICD-10-CM | POA: Diagnosis not present

## 2023-05-03 ENCOUNTER — Ambulatory Visit
Admission: RE | Admit: 2023-05-03 | Discharge: 2023-05-03 | Disposition: A | Discharge status: Home / Self Care | Payer: Medicare Other | Source: Ambulatory Visit | Attending: Family Medicine | Admitting: Family Medicine

## 2023-05-03 DIAGNOSIS — Z1231 Encounter for screening mammogram for malignant neoplasm of breast: Secondary | ICD-10-CM | POA: Diagnosis not present

## 2023-05-03 DIAGNOSIS — Z0000 Encounter for general adult medical examination without abnormal findings: Secondary | ICD-10-CM

## 2023-07-23 DIAGNOSIS — Z923 Personal history of irradiation: Secondary | ICD-10-CM | POA: Diagnosis not present

## 2023-07-23 DIAGNOSIS — R300 Dysuria: Secondary | ICD-10-CM | POA: Diagnosis not present

## 2023-08-03 DIAGNOSIS — L578 Other skin changes due to chronic exposure to nonionizing radiation: Secondary | ICD-10-CM | POA: Diagnosis not present

## 2023-08-03 DIAGNOSIS — L723 Sebaceous cyst: Secondary | ICD-10-CM | POA: Diagnosis not present

## 2023-08-03 DIAGNOSIS — D225 Melanocytic nevi of trunk: Secondary | ICD-10-CM | POA: Diagnosis not present

## 2023-08-03 DIAGNOSIS — L821 Other seborrheic keratosis: Secondary | ICD-10-CM | POA: Diagnosis not present

## 2023-08-03 DIAGNOSIS — L814 Other melanin hyperpigmentation: Secondary | ICD-10-CM | POA: Diagnosis not present

## 2023-09-20 ENCOUNTER — Telehealth: Payer: Self-pay | Admitting: *Deleted

## 2023-09-20 NOTE — Telephone Encounter (Signed)
Called patient to ask about rescheduling fu on 09-23-23 due to Dr. Roselind Messier being in the OR, rescheduled appt. for 09-23-23 @ 11:15 am, lvm for a return call

## 2023-09-21 DIAGNOSIS — Z9013 Acquired absence of bilateral breasts and nipples: Secondary | ICD-10-CM | POA: Diagnosis not present

## 2023-09-22 NOTE — Progress Notes (Signed)
Radiation Oncology         [phone] ________________________________  Name: Paula Newman MRN: [id number]  Date: 09/23/2023  DOB: 1951-01-05  Follow-Up Visit Note  CC: Deatra Marisue Canion, MD  Adolphus Birchwood, MD  No diagnosis found.  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 She was last seen here for follow-up on 12/07/22.             Since her last visit, she most recently followed up with Dr. Pricilla Holm on 03/19/23. During which time, she denied any symptoms concerning for disease recurrence and she was noted as NED on examination. She also reported having a significant improvement in her bowel function with Metamucil.       No other significant oncologic interval history since the patient was last seen for follow-up.                Of note: her most recent bilateral screening mammogram on 05/03/23 showed no evidence of malignancy in either breast.   ***    Allergies:  is allergic to coconut (cocos nucifera), coconut oil, and codeine.  Meds: Current Outpatient Medications  Medication Sig Dispense Refill   docusate sodium (COLACE) 100 MG capsule Take 100 mg by mouth 2 (two) times daily.     ezetimibe (ZETIA) 10 MG tablet Take 10 mg by mouth daily.     Psyllium (METAMUCIL PREMIUM BLEND) 52.63 % POWD as directed Orally every other day     simvastatin (ZOCOR) 20 MG tablet Take 10 mg by mouth daily at 6 PM.     No current facility-administered  medications for this encounter.    Physical Findings: The patient is in no acute distress. Patient is alert and oriented.  vitals were not taken for this visit. .  No significant changes. Lungs are clear to auscultation bilaterally. Heart has regular rate and rhythm. No palpable cervical, supraclavicular, or axillary adenopathy. Abdomen soft, non-tender, normal bowel sounds.  On pelvic examination the external genitalia were unremarkable. A speculum exam was performed. There are no mucosal lesions noted in the vaginal vault. A Pap smear was obtained of the proximal vagina. On bimanual and rectovaginal examination there were no pelvic masses appreciated. ***   Lab Findings: Lab Results  Component Value Date   WBC 6.0 09/03/2020   HGB 12.8 09/03/2020   HCT 38.7 09/03/2020   MCV 89.4 09/03/2020   PLT 283 09/03/2020    Radiographic Findings: No results found.  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  ***  Plan:  ***   *** minutes of total time was spent for this patient encounter, including preparation, face-to-face counseling with the patient and coordination of care, physical exam, and documentation of the encounter. ____________________________________  Billie Lade, PhD, MD  This document serves as a record of services personally performed by Antony Blackbird,  MD. It was created on his behalf by Neena Rhymes, a trained medical scribe. The creation of this record is based on the scribe's personal observations and the provider's statements to them. This document has been checked and approved by the attending provider.

## 2023-09-23 ENCOUNTER — Encounter: Payer: Self-pay | Admitting: Radiation Oncology

## 2023-09-23 ENCOUNTER — Ambulatory Visit
Admission: RE | Admit: 2023-09-23 | Discharge: 2023-09-23 | Disposition: A | Discharge status: Home / Self Care | Payer: Medicare Other | Source: Ambulatory Visit | Attending: Radiation Oncology | Admitting: Radiation Oncology

## 2023-09-23 VITALS — BP 116/67 | HR 73 | Temp 97.5°F | Resp 18 | Ht 61.0 in | Wt 123.0 lb

## 2023-09-23 DIAGNOSIS — C541 Malignant neoplasm of endometrium: Secondary | ICD-10-CM

## 2023-09-23 NOTE — Progress Notes (Addendum)
ABRYANA KORZENIEWSKI is here today for follow up post radiation to the pelvic.  They completed their radiation on: 12/17/2020  Does the patient complain of any of the following:  Pain:No Abdominal bloating: No Diarrhea/Constipation: No Nausea/Vomiting: No Vaginal Discharge: No Blood in Urine or Stool: No Urinary Issues (dysuria/incomplete emptying/ incontinence/ increased frequency/urgency): No Does patient report using vaginal dilator 2-3 times a week and/or sexually active 2-3 weeks: Yes she reports using dilators and being sexually active. Post radiation skin changes: No  Questions: She wants to know if it is okay to have a colonoscopy.  BP 116/67 (BP Location: Left Arm, Patient Position: Sitting)   Pulse 73   Temp (!) 97.5 F (36.4 C) (Temporal)   Resp 18   Ht 5\' 1"  (1.549 m)   Wt 123 lb (55.8 kg)   SpO2 99%   BMI 23.24 kg/m

## 2023-09-27 ENCOUNTER — Ambulatory Visit
Admission: RE | Admit: 2023-09-27 | Discharge: 2023-09-27 | Disposition: A | Discharge status: Home / Self Care | Payer: Medicare Other | Source: Ambulatory Visit | Attending: Family Medicine | Admitting: Family Medicine

## 2023-09-27 DIAGNOSIS — M8588 Other specified disorders of bone density and structure, other site: Secondary | ICD-10-CM | POA: Diagnosis not present

## 2023-09-27 DIAGNOSIS — M810 Age-related osteoporosis without current pathological fracture: Secondary | ICD-10-CM

## 2023-12-08 ENCOUNTER — Telehealth: Payer: Self-pay | Admitting: Oncology

## 2023-12-08 ENCOUNTER — Encounter: Payer: Self-pay | Admitting: Gynecologic Oncology

## 2023-12-08 NOTE — Telephone Encounter (Signed)
 Paula Newman called and said she had vaginal bleeding this morning when she wiped.  It was after intercourse.  The bleeding has stopped now.  She denies having any pain.  She has stopped using the vaginal dilator because she is sexually active.  Scheduled an appointment with Dr. Antionette Char on 12/22/23 at 9:15 and also an appointment with Dr. Pricilla Holm on 03/10/24 for her surveillance visit.  Advised her to call back if she has any more bleeding and to abstain from putting anything in the vagina for a few days.

## 2023-12-22 ENCOUNTER — Encounter: Payer: Self-pay | Admitting: Obstetrics & Gynecology

## 2023-12-22 ENCOUNTER — Inpatient Hospital Stay: Payer: Medicare Other | Attending: Obstetrics & Gynecology | Admitting: Obstetrics & Gynecology

## 2023-12-22 VITALS — BP 119/62 | HR 70 | Temp 97.7°F | Resp 18 | Wt 122.6 lb

## 2023-12-22 DIAGNOSIS — Z8542 Personal history of malignant neoplasm of other parts of uterus: Secondary | ICD-10-CM | POA: Diagnosis not present

## 2023-12-22 DIAGNOSIS — C541 Malignant neoplasm of endometrium: Secondary | ICD-10-CM | POA: Insufficient documentation

## 2023-12-22 DIAGNOSIS — N930 Postcoital and contact bleeding: Secondary | ICD-10-CM | POA: Insufficient documentation

## 2023-12-22 NOTE — Progress Notes (Signed)
 Follow Up Note: Gyn-Onc  Paula Newman 73 y.o. female  CC: Vaginal bleeding   HPI: The oncology history was reviewed.  Interval History: Paula Newman is a 73 year old female with endometrial cancer who presents with post-coital bleeding.  She experiences post-coital bleeding after intercourse, which has occurred once before approximately a year and a half ago. During the recent episode, she noticed a significant amount of blood on the first wipe, followed by small specks on the second wipe. She is sexually active and uses dilators regularly, although she had a lapse in her use for about four weeks due to her brother's illness and subsequent passing. She uses Coca-Cola as a lubricant during intercourse. There are no other concomitant worrisome sxs.   Review of Systems  Review of Systems  Constitutional:  Negative for malaise/fatigue and weight loss.  Respiratory:  Negative for shortness of breath and wheezing.   Cardiovascular:  Negative for chest pain and leg swelling.  Gastrointestinal:  Negative for abdominal pain, blood in stool, constipation, nausea and vomiting.  Genitourinary:  Negative for dysuria, frequency, hematuria and urgency.   See above Musculoskeletal:  Negative for joint pain and myalgias.  Neurological:  Negative for weakness.  Psychiatric/Behavioral:  Negative for depression. The patient does not have insomnia.    Current medications, allergy, social history, past surgical history, past medical history, family history were all reviewed.    Vitals:  BP 119/62 (BP Location: Left Arm, Patient Position: Sitting)   Pulse 70   Temp 97.7 F (36.5 C) (Oral)   Resp 18   Wt 122 lb 9.6 oz (55.6 kg)   SpO2 95%   BMI 23.17 kg/m    Physical Exam:  Physical Exam Exam conducted with a chaperone present.  Constitutional:      General: She is not in acute distress. Cardiovascular:     Rate and Rhythm: Normal rate and regular rhythm.  Pulmonary:     Effort:  Pulmonary effort is normal.     Breath sounds: Normal breath sounds. No wheezing or rhonchi.  Abdominal:     Palpations: Abdomen is soft.     Tenderness: There is no abdominal tenderness. There is no right CVA tenderness or left CVA tenderness.     Hernia: No hernia is present.  Genitourinary:    General: Normal vulva.     Urethra: No urethral lesion.     Vagina: No lesions. No bleeding. Scarring at the apex Musculoskeletal:     Cervical back: Neck supple.     Right lower leg: No edema.     Left lower leg: No edema.  Lymphadenopathy:     Upper Body:     Right upper body: No supraclavicular adenopathy.     Left upper body: No supraclavicular adenopathy.     Lower Body: No right inguinal adenopathy. No left inguinal adenopathy.  Skin:    Findings: No rash.  Neurological:     Mental Status: She is oriented to person, place, and time.   Assessment/Plan:  Postcoital bleeding Bleeding likely due to trauma from thin, friable vaginal tissues exacerbated by scarring and lapse in dilator use. Unremarkable exam - Perform speculum examination for lesions or abnormalities. - Continue regular use of vaginal dilators. - Liberal use of lubricants during intercourse. - Counseled re: moisturizer use--education materials provided.  Endometrial cancer No signs of recurrence or metastasis.  - Follow up with Dr. Pricilla Holm as scheduled.  I personally spent 25 minutes face-to-face and non-face-to-face in the  care of this patient, which includes all pre, intra, and post visit time on the date of service.    Antionette Char, MD

## 2023-12-22 NOTE — Patient Instructions (Signed)
 Keep previously scheduled appointment with Dr. Pricilla Holm   .Vulvar/Vaginal Moisturizers  Moisturizer Options: Vitamin E oil: pump or capsule form Vitamin E cream (Gene's vitamin E cream) Coconut oil: bottle or bead form Shea butter Blossom Organic Lubricant (organic and all natural; www.blossomorganics.com) PE suppository(coconut oil/vitamin E/palm oil) Desert Harvest Aloe Glide      Consider the ingredients of the product - the fewer the ingredients the better!  Directions for Use: Clean and dry your hands Gently dab the vulvar/vaginal area dry as needed Apply a "pea-sized" amount of the moisturizer onto your fingertip Using you other hand, open the labia   Apply the moisturizer to the vulvar/vaginal tissues Wear loose fitting underwear/clothing if possible following application  Use moisturize 2-3 times daily as desired.

## 2023-12-27 DIAGNOSIS — L723 Sebaceous cyst: Secondary | ICD-10-CM | POA: Diagnosis not present

## 2024-01-25 DIAGNOSIS — L723 Sebaceous cyst: Secondary | ICD-10-CM | POA: Diagnosis not present

## 2024-03-08 ENCOUNTER — Encounter: Payer: Self-pay | Admitting: Gynecologic Oncology

## 2024-03-10 ENCOUNTER — Encounter: Payer: Self-pay | Admitting: Gynecologic Oncology

## 2024-03-10 ENCOUNTER — Inpatient Hospital Stay: Payer: Medicare Other | Attending: Obstetrics & Gynecology | Admitting: Gynecologic Oncology

## 2024-03-10 VITALS — BP 114/67 | HR 69 | Temp 98.1°F | Resp 16 | Ht 61.0 in | Wt 125.2 lb

## 2024-03-10 DIAGNOSIS — Z90722 Acquired absence of ovaries, bilateral: Secondary | ICD-10-CM | POA: Diagnosis not present

## 2024-03-10 DIAGNOSIS — Z90710 Acquired absence of both cervix and uterus: Secondary | ICD-10-CM | POA: Diagnosis not present

## 2024-03-10 DIAGNOSIS — Z9079 Acquired absence of other genital organ(s): Secondary | ICD-10-CM | POA: Diagnosis not present

## 2024-03-10 DIAGNOSIS — Z08 Encounter for follow-up examination after completed treatment for malignant neoplasm: Secondary | ICD-10-CM | POA: Diagnosis not present

## 2024-03-10 DIAGNOSIS — Z8542 Personal history of malignant neoplasm of other parts of uterus: Secondary | ICD-10-CM | POA: Insufficient documentation

## 2024-03-10 DIAGNOSIS — Z923 Personal history of irradiation: Secondary | ICD-10-CM | POA: Insufficient documentation

## 2024-03-10 DIAGNOSIS — C541 Malignant neoplasm of endometrium: Secondary | ICD-10-CM

## 2024-03-10 NOTE — Progress Notes (Signed)
 Gynecologic Oncology Return Clinic Visit  03/10/24  Reason for Visit: Surveillance in the setting of uterine cancer    Treatment History: Her symptoms began in October 2021 with vaginal bleeding which was postmenopausal.    She saw her primary care physician for a symptom related visit who referred her to see Dr. Shelah Derry (she did not have a gynecologist that she received GYN care from her PCP) on August 07, 2020.   Transvaginal US  on July 30, 2020 showed a uterus measuring 7.7 x 3.9 x 5.3 cm with an endometrial thickness of 23 mm.   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 Pap testing was normal on 08/07/2020.   On 09/10/20 she underwent robotic assisted total hysterectomy, BSO, SLN biopsy. Intraoperative findings were significant for a 6cm normal appearing uterus with normal tubes and ovaries and no gross extrauterine disease. Surgery was uncomplicated.    Final pathology revealed a FIGO grade 2 endometrioid endometrial adenocarcinoma with deep myometrial invasion (16mm of 17mm thickness) with LVSI present. The adnexa and cervix were free of tumor. However, there were isolated tumor cells in both SLN's. MMR testing was in tact with MSS phenotype. This was staged as a stage IB grade 2 endometrial cancer with high/intermediate risk factors for recurrence and adjuvant radiation was recommended in accordance with NCCN guidelines.   She received adjuvant radiation (whole pelvic RT and vaginal brachytherapy) between 10/24/20 through 12/17/20. She received IMRT with 45 Gy to the pelvis in 25 fractions and 3 fractions x 6 Gy (total 18 Gy) to the vagina via brachytherapy.  Interval History: Recently seen for postcoital bleeding, no lesions noted on exam.  Denies any further vaginal bleeding.  Overall doing well.  Reports good bowel function with the use of daily Metamucil, half a teaspoon of  MiraLAX, and cranberry juice.  Denies any urinary symptoms.  Denies any change to bladder function.  Notes intermittent left mid/lower discomfort first thing in the morning, relieved when she has a bowel movement.  Denies any blood in her stool.  Past Medical/Surgical History: Past Medical History:  Diagnosis Date   Cancer [facility])    endometrial   Complication of anesthesia    slow to wake up   History of radiation therapy 12/03/2020-12/17/2020   vaginal brachytherapy      Dr Retta Caster   History of radiation therapy 10/24/2020-11/27/2020   Pelvic IMRT   Dr Retta Caster   Hyperlipidemia     Past Surgical History:  Procedure Laterality Date   AUGMENTATION MAMMAPLASTY Bilateral    BREAST EXCISIONAL BIOPSY Bilateral    BREAST IMPLANT REMOVAL Bilateral 01/07/2021   Procedure: REMOVAL BREAST IMPLANTS, CAPSULECTOMIES;  Surgeon: Alger Infield, MD;  Location: [facility];  Service: Plastics;  Laterality: Bilateral;   CATARACT EXTRACTION Right 09/23/2021   MENISCUS REPAIR Bilateral    PLACEMENT OF BREAST IMPLANTS Bilateral 01/07/2021   Procedure: PLACEMENT OF BREAST IMPLANTS;  Surgeon: Alger Infield, MD;  Location: [facility];  Service: Plastics;  Laterality: Bilateral;   ROBOTIC ASSISTED TOTAL HYSTERECTOMY WITH BILATERAL SALPINGO OOPHERECTOMY Bilateral 09/10/2020   Procedure: XI ROBOTIC ASSISTED TOTAL HYSTERECTOMY WITH BILATERAL SALPINGO OOPHORECTOMY;  Surgeon: Alphonso Aschoff, MD;  Location: [facility];  Service: Gynecology;  Laterality: Bilateral;   SENTINEL NODE BIOPSY N/A 09/10/2020   Procedure: SENTINEL NODE BIOPSY;  Surgeon: Alphonso Aschoff, MD;  Location: [facility];  Service: Gynecology;  Laterality: N/A;    Family History  Problem Relation Age of Onset   Hypertension Mother    Hypertension Father    Breast cancer Sister 54   Breast cancer Paternal Grandmother     Social History   Socioeconomic History   Marital status: Married    Spouse name: Not on file    Number of children: 1   Years of education: Not on file   Highest education level: Not on file  Occupational History   Not on file  Tobacco Use   Smoking status: Never   Smokeless tobacco: Never  Vaping Use   Vaping status: Never Used  Substance and Sexual Activity   Alcohol use: Yes    Comment: rare occasion   2 glasses a year   Drug use: Never   Sexual activity: Yes    Birth control/protection: None  Other Topics Concern   Not on file  Social History Narrative   Not on file   Social Drivers of Health   Financial Resource Strain: Not on file  Food Insecurity: Not on file  Transportation Needs: Not on file  Physical Activity: Not on file  Stress: Not on file  Social Connections: Not on file    Current Medications:  Current Outpatient Medications:    docusate sodium  (COLACE) 100 MG capsule, Take 100 mg by mouth 2 (two) times daily., Disp: , Rfl:    ezetimibe (ZETIA) 10 MG tablet, Take 10 mg by mouth daily., Disp: , Rfl:    Psyllium (METAMUCIL PREMIUM BLEND) 52.63 % POWD, as directed Orally every other day, Disp: , Rfl:    simvastatin (ZOCOR) 20 MG tablet, Take 10 mg by mouth daily at 6 PM., Disp: , Rfl:   Review of Systems: Denies appetite changes, fevers, chills, fatigue, unexplained weight changes. Denies hearing loss, neck lumps or masses, mouth sores, ringing in ears or voice changes. Denies cough or wheezing.  Denies shortness of breath. Denies chest pain or palpitations. Denies leg swelling. Denies abdominal distention, pain, blood in stools, constipation, diarrhea, nausea, vomiting, or early satiety. Denies pain with intercourse, dysuria, frequency, hematuria or incontinence. Denies hot flashes, pelvic pain, vaginal bleeding or vaginal discharge.   Denies joint pain, back pain or muscle pain/cramps. Denies itching, rash, or wounds. Denies dizziness, headaches, numbness or seizures. Denies swollen lymph nodes or glands, denies easy bruising or bleeding. Denies  anxiety, depression, confusion, or decreased concentration.  Physical Exam: BP 114/67 (BP Location: Left Arm, Patient Position: Sitting)   Pulse 69   Temp 98.1 F (36.7 C) (Oral)   Resp 16   Ht 5\' 1"  (1.549 m)   Wt 125 lb 3.2 oz (56.8 kg)   SpO2 100%   BMI 23.66 kg/m  General: Alert, oriented, no acute distress. HEENT: Normocephalic, atraumatic, sclera anicteric. Chest: Clear to auscultation bilaterally.  No wheezes or rhonchi. Cardiovascular: Regular rate and rhythm, no murmurs. Abdomen: soft, nontender.  Normoactive bowel sounds.  No masses or hepatosplenomegaly appreciated.  Well-healed incisions. Extremities: Grossly normal range of motion.  Warm, well perfused.  No edema bilaterally. Skin: No rashes or lesions noted. Lymphatics: No cervical, supraclavicular, or inguinal adenopathy. GU: Normal appearing external genitalia without erythema, excoriation, or lesions.  Speculum exam reveals mildly-moderately atrophic vaginal mucosa with radiation changes noted, findings stable.  Bimanual exam reveals no nodularity or masses, some scar tissue noted along the midline of the vagina, unchanged.  On rectovaginal exam, no masses or nodularity appreciated.  T  Laboratory & Radiologic Studies: None new  Assessment & Plan: Paula Newman  is a 73 y.o. 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 MMR normal. High-intermediate risk factors. EBRT/VBT completed on 12/17/20.   Continues to do well and is NED on exam.  Bowel function has improved significantly with use of Metamucil every other day.  Discussed her concerns about her colon.  I suspect that the intermittent discomfort that she feels pursing in the morning is related to stool in her lower sigmoid/rectum.  This resolves when she has had bowel function for the day.  She is scheduled to see her gastroenterologist next  week.  She is concerned about possible need for another colonoscopy.  I encouraged her to address this next week.   Per NCCN surveillance recommendations, we will continue with visits every 6 months alternating between my office and radiation oncology.  We reviewed signs and symptoms that would be concerning for cancer recurrence, and I stressed the importance of calling if she develops any of these.    22 minutes of total time was spent for this patient encounter, including preparation, face-to-face counseling with the patient and coordination of care, and documentation of the encounter.  Wiley Hanger, MD  Division of Gynecologic Oncology  Department of Obstetrics and Gynecology  [facility]

## 2024-03-10 NOTE — Patient Instructions (Signed)
 It was good to see you today.  I do not see or feel any evidence of cancer recurrence on your exam.  We will see you for follow-up in 12 months.  As always, if you develop any new and concerning symptoms before your next visit, please call to see me sooner.

## 2024-03-15 DIAGNOSIS — Z0000 Encounter for general adult medical examination without abnormal findings: Secondary | ICD-10-CM | POA: Diagnosis not present

## 2024-03-15 DIAGNOSIS — I700 Atherosclerosis of aorta: Secondary | ICD-10-CM | POA: Diagnosis not present

## 2024-03-15 DIAGNOSIS — L02224 Furuncle of groin: Secondary | ICD-10-CM | POA: Diagnosis not present

## 2024-03-15 DIAGNOSIS — M810 Age-related osteoporosis without current pathological fracture: Secondary | ICD-10-CM | POA: Diagnosis not present

## 2024-03-15 DIAGNOSIS — E785 Hyperlipidemia, unspecified: Secondary | ICD-10-CM | POA: Diagnosis not present

## 2024-04-03 ENCOUNTER — Other Ambulatory Visit: Payer: Self-pay | Admitting: Family Medicine

## 2024-04-03 DIAGNOSIS — Z1231 Encounter for screening mammogram for malignant neoplasm of breast: Secondary | ICD-10-CM

## 2024-04-06 DIAGNOSIS — H5201 Hypermetropia, right eye: Secondary | ICD-10-CM | POA: Diagnosis not present

## 2024-04-06 DIAGNOSIS — H43812 Vitreous degeneration, left eye: Secondary | ICD-10-CM | POA: Diagnosis not present

## 2024-05-04 ENCOUNTER — Ambulatory Visit

## 2024-05-04 ENCOUNTER — Ambulatory Visit
Admission: RE | Admit: 2024-05-04 | Discharge: 2024-05-04 | Disposition: A | Discharge status: Home / Self Care | Source: Ambulatory Visit | Attending: Family Medicine | Admitting: Family Medicine

## 2024-05-04 DIAGNOSIS — Z1231 Encounter for screening mammogram for malignant neoplasm of breast: Secondary | ICD-10-CM

## 2024-06-20 DIAGNOSIS — L309 Dermatitis, unspecified: Secondary | ICD-10-CM | POA: Diagnosis not present

## 2024-09-27 NOTE — Progress Notes (Signed)
 Paula Newman is here today for follow up post radiation to the pelvis.  They completed radiation to the endometrium on: 12/17/2020   Does the patient complain of any of the following:  Pain: No Abdominal bloating:  yes Diarrhea/Constipation:  yes, constipation taking emma.  Nausea/Vomiting:  No  Vaginal Discharge: No Blood in Urine or Stool: No Urinary Issues (dysuria/incomplete emptying/ incontinence/ increased frequency/urgency): No Does patient report using vaginal dilator 2-3 times a week and/or sexually active 2-3 weeks: Yes, sexually active.  Post radiation skin changes: No   Additional comments if applicable:  BP 113/73 (BP Location: Left Arm, Patient Position: Sitting)   Pulse 69   Temp (!) 97.5 F (36.4 C) (Temporal)   Resp 18   Ht 5' 1 (1.549 m)   Wt 122 lb (55.3 kg)   SpO2 100%   BMI 23.05 kg/m

## 2024-09-28 ENCOUNTER — Inpatient Hospital Stay
Admission: RE | Admit: 2024-09-28 | Discharge: 2024-09-28 | Payer: Self-pay | Attending: Radiation Oncology | Admitting: Radiation Oncology

## 2024-09-28 ENCOUNTER — Encounter: Payer: Self-pay | Admitting: Radiation Oncology

## 2024-09-28 VITALS — BP 113/73 | HR 69 | Temp 97.5°F | Resp 18 | Ht 61.0 in | Wt 122.0 lb

## 2024-09-28 DIAGNOSIS — C541 Malignant neoplasm of endometrium: Secondary | ICD-10-CM

## 2024-09-28 NOTE — Progress Notes (Signed)
 Radiation Oncology         [phone] ________________________________  Name: Paula Newman MRN: [id number]  Date: 09/28/2024  DOB: 01/05/1951  Follow-Up Visit Note  CC: Sun, Vyvyan, MD  Eloy Herring, MD    ICD-10-CM   1. Endometrial cancer (HCC)  C54.1        Diagnosis:   Stage IB, grade 2, endometrioid endometrial cancer with high risk features (deep myometrial invasion, LVSI, and isolated tumor cells)    Interval Since Last Radiation: 3 years, 9 months, and 8 days    Radiation Treatment Dates: 10/24/2020 through 12/17/2020   Site: Uterus Technique: IMRT Total Dose (Gy): 45/45 Dose per Fx (Gy): 1.8 Completed Fx: 25/25 Beam Energies: 6X    Site: Vagina, pelvis Technique: HDR-brachytherapy Total Dose (Gy): 18/18 Dose per Fx (Gy): 6 Completed Fx: 3/3 Beam Energies: Ir-192   Narrative:  The patient returns today for routine follow-up. She was last seen here for follow-up on 09/23/23. Patient continued to follow up with their specialists to manage their chronic conditions.   In the interval since she was last seen, she presented for a follow up visit with Dr. Rogelio on 12/22/23 after noticing some vaginal bleeding after intercourse. She had an unremarkable exam and bleeding was believed to be caused due to trauma from thin, friable vaginal tissues exacerbated by scarring and lapse in dilator use.    She was seen by Dr. Viktoria on 03/10/24 during which she was noted NED on exam with no symptoms or concerns indication disease recurrence.                     No other significant oncologic interval history since the patient was last seen.   Of note, she underwent a bilateral screening mammogram on 05/04/24 showing no evidence of malignancy.  On evaluation today she denies any further issues with vaginal bleeding.  She denies any abdominal bloating hematuria or rectal bleeding.                                       Allergies:  is allergic to coconut (cocos nucifera),  coconut oil, and codeine.  Meds: Current Outpatient Medications  Medication Sig Dispense Refill   ezetimibe (ZETIA) 10 MG tablet Take 10 mg by mouth daily.     docusate sodium  (COLACE) 100 MG capsule Take 100 mg by mouth 2 (two) times daily. (Patient not taking: Reported on 09/28/2024)     Psyllium (METAMUCIL PREMIUM BLEND) 52.63 % POWD as directed Orally every other day     simvastatin (ZOCOR) 20 MG tablet Take 10 mg by mouth daily at 6 PM.     No current facility-administered medications for this encounter.    Physical Findings: The patient is in no acute distress. Patient is alert and oriented.  Accompanied by her husband on evaluation today.  height is 5' 1 (1.549 m) and weight is 122 lb (55.3 kg). Her temporal temperature is 97.5 F (36.4 C) (abnormal). Her blood pressure is 113/73 and her pulse is 69. Her respiration is 18 and oxygen saturation is 100%. .  No significant changes. Lungs are clear to auscultation bilaterally. Heart has regular rate and rhythm. No palpable cervical, supraclavicular, or axillary adenopathy. Abdomen soft, non-tender, normal bowel sounds.  On pelvic examination the external genitalia were unremarkable. A speculum exam was performed. There are no mucosal lesions noted in the vaginal  vault.  Some radiation changes noted in the proximal vagina.  On bimanual and rectovaginal examination there were no pelvic masses appreciated.  Some scar tissue noted along the region of the vaginal cuff.  No nodularity.  Vaginal cuff intact.      Lab Findings: Lab Results  Component Value Date   WBC 6.0 09/03/2020   HGB 12.8 09/03/2020   HCT 38.7 09/03/2020   MCV 89.4 09/03/2020   PLT 283 09/03/2020    Radiographic Findings: No results found.  Impression: Stage IB, grade 2, endometrioid endometrial cancer with high risk features (deep myometrial invasion, LVSI, and isolated tumor cells)    The patient is doing well overall today. No signs of disease recurrence on  clinical exam today.    Plan:  Per NCCN guidelines, patient will follow up with Dr. Viktoria in 6 months and radiation oncology in 12 months. Patient was educated on signs/symptoms that may indicate disease recurrence and understands to notify us  if she experiences any of them.   25 minutes of total time was spent for this patient encounter, including preparation, face-to-face counseling with the patient and coordination of care, physical exam, and documentation of the encounter. ____________________________________   Leeroy Due, PA-C   Lynwood CHARM Nasuti, PhD, MD   [facility]  Radiation Oncology Direct Dial: [phone]  Fax: [phone] [email or web address]    This document serves as a record of services personally performed by Lynwood Nasuti, MD. It was created on his behalf by Reymundo Cartwright, a trained medical scribe. The creation of this record is based on the scribe's personal observations and the provider's statements to them. This document has been checked and approved by the attending provider.

## 2024-10-24 NOTE — Progress Notes (Signed)
" °  Cardiology Office Note:  .   Date:  10/27/2024  ID:  Paula Newman, DOB 07/07/1951, MRN [id number] PCP: Sun, Vyvyan, MD  [facility] Providers Cardiologist:  None   History of Present Illness: .    Chief Complaint  Patient presents with   Hyperlipidemia    Paula Newman is a 74 y.o. female with below history who presents for the evaluation of HLD at the request of Sun, Vyvyan, MD.  History of Present Illness   Paula Newman is a 74 year old female with hyperlipidemia who presents for evaluation.  She is currently managing her hyperlipidemia with simvastatin and Zetia, taking half a pill of each medication. She maintains an active lifestyle, engaging in physical activity for about twelve hours a day. No chest pain or shortness of breath. She denies any history of diabetes, heart attack, or stroke.  Her past medical history includes uterine cancer diagnosed in 2021, which is currently in remission following surgery and radiation therapy. She continues to be monitored by her oncology and OB GYN specialists.  In terms of her social history, she is retired after a 28-year career in [employer] and [employer] at [employer]. She has never smoked and consumes alcohol minimally, with only three to four glasses per year. There is no known family history of heart disease.           Problem List Endometrial CA HLD -T chol 153, HDL 68, LDL 74, TG 54153    ROS: All other ROS reviewed and negative. Pertinent positives noted in the HPI.     Studies Reviewed: SABRA   EKG Interpretation Date/Time:  Friday October 27 2024 13:15:02 EST Ventricular Rate:  67 PR Interval:  140 QRS Duration:  84 QT Interval:  418 QTC Calculation: 441 R Axis:   59  Text Interpretation: Normal sinus rhythm Normal ECG Confirmed by Barbaraann Kotyk [phone]) on 10/27/2024 1:39:08 PM   Physical Exam:   VS:  BP 122/76 (BP Location: Left Arm, Patient Position: Sitting, Cuff Size:  Normal)   Pulse 67   Ht 5' 4 (1.626 m)   Wt 124 lb 14.4 oz (56.7 kg)   SpO2 99%   BMI 21.44 kg/m    Wt Readings from Last 3 Encounters:  10/27/24 124 lb 14.4 oz (56.7 kg)  09/28/24 122 lb (55.3 kg)  03/10/24 125 lb 3.2 oz (56.8 kg)    GEN: Well nourished, well developed in no acute distress NECK: No JVD; No carotid bruits CARDIAC: RRR, no murmurs, rubs, gallops RESPIRATORY:  Clear to auscultation without rales, wheezing or rhonchi  ABDOMEN: Soft, non-tender, non-distended EXTREMITIES:  No edema; No deformity  ASSESSMENT AND PLAN: .   Assessment and Plan    Mixed hyperlipidemia Managed with simvastatin and Zetia. Lifetime ASCVD risk score 9.9%, intermediate risk. - Continue simvastatin and Zetia. - Ordered coronary calcium scoring.  Cardiovascular risk assessment and counseling Coronary calcium scoring recommended for baseline coronary artery disease risk assessment. Results will guide management decisions. - Scheduled coronary calcium scoring. - Will discuss results and management plan based on findings.                Follow-up: Return if symptoms worsen or fail to improve.  Signed, Kotyk DASEN. Barbaraann, MD, [facility]  [facility]  [address] [place], [place] [address] [phone]  2:10 PM   "

## 2024-10-27 ENCOUNTER — Encounter (HOSPITAL_BASED_OUTPATIENT_CLINIC_OR_DEPARTMENT_OTHER): Payer: Self-pay | Admitting: Cardiovascular Disease

## 2024-10-27 ENCOUNTER — Ambulatory Visit (HOSPITAL_BASED_OUTPATIENT_CLINIC_OR_DEPARTMENT_OTHER): Admitting: Cardiovascular Disease

## 2024-10-27 VITALS — BP 122/76 | HR 67 | Ht 64.0 in | Wt 124.9 lb

## 2024-10-27 DIAGNOSIS — E782 Mixed hyperlipidemia: Secondary | ICD-10-CM | POA: Diagnosis not present

## 2024-10-27 DIAGNOSIS — Z7189 Other specified counseling: Secondary | ICD-10-CM | POA: Diagnosis not present

## 2024-10-27 NOTE — Patient Instructions (Signed)
 Medication Instructions:  No changes *If you need a refill on your cardiac medications before your next appointment, please call your pharmacy*  Lab Work: none If you have labs (blood work) drawn today and your tests are completely normal, you will receive your results only by: MyChart Message (if you have MyChart) OR A paper copy in the mail If you have any lab test that is abnormal or we need to change your treatment, we will call you to review the results.  Testing/Procedures: Calcium Score CT Scan - $99 out of pocket cost  Follow-Up: As needed

## 2024-11-07 ENCOUNTER — Ambulatory Visit (HOSPITAL_BASED_OUTPATIENT_CLINIC_OR_DEPARTMENT_OTHER)
Admission: RE | Admit: 2024-11-07 | Discharge: 2024-11-07 | Disposition: A | Discharge status: Home / Self Care | Payer: Self-pay | Source: Ambulatory Visit | Attending: Cardiovascular Disease | Admitting: Cardiovascular Disease

## 2024-11-07 DIAGNOSIS — Z7189 Other specified counseling: Secondary | ICD-10-CM | POA: Insufficient documentation

## 2024-11-08 ENCOUNTER — Ambulatory Visit: Payer: Self-pay | Admitting: Cardiovascular Disease

## 2025-03-27 ENCOUNTER — Ambulatory Visit: Admitting: Gynecologic Oncology

## 2025-09-24 ENCOUNTER — Ambulatory Visit: Admitting: Radiation Oncology
